# Patient Record
Sex: Female | Born: 1985 | Race: White | Hispanic: No | Marital: Single | Smoking: Current every day smoker
Health system: Southern US, Community
[De-identification: ages and names within clinical notes are randomized; demographics above are authoritative.]

## PROBLEM LIST (undated history)

## (undated) DIAGNOSIS — E119 Type 2 diabetes mellitus without complications: Secondary | ICD-10-CM

## (undated) DIAGNOSIS — F988 Other specified behavioral and emotional disorders with onset usually occurring in childhood and adolescence: Secondary | ICD-10-CM

## (undated) DIAGNOSIS — R87629 Unspecified abnormal cytological findings in specimens from vagina: Secondary | ICD-10-CM

## (undated) HISTORY — PX: LEEP: SHX91

## (undated) HISTORY — DX: Other specified behavioral and emotional disorders with onset usually occurring in childhood and adolescence: F98.8

## (undated) HISTORY — DX: Type 2 diabetes mellitus without complications: E11.9

## (undated) HISTORY — PX: WISDOM TOOTH EXTRACTION: SHX21

## (undated) HISTORY — DX: Unspecified abnormal cytological findings in specimens from vagina: R87.629

---

## 2006-06-07 HISTORY — PX: CHOLECYSTECTOMY: SHX55

## 2010-06-07 NOTE — L&D Delivery Note (Signed)
Delivery Note At 1:26 AM a viable female was delivered via Vaginal, Spontaneous Delivery (Presentation: Left Occiput Anterior).  APGAR: 9, 9; weight 7 lb 0.2 oz (3180 g).   Placenta status: Intact, Spontaneous.  Cord: 3 vessels with the following complications: None.   Anesthesia: 1% lidocaine local infiltrates Episiotomy: None Lacerations: 2nd degree perineal Suture Repair: 2.0 vicryl Est. Blood Loss (mL):  Mom to postpartum.  Baby to nursery-stable.  Mindy Bradford 02/15/2011, 1:43 AM

## 2011-01-07 ENCOUNTER — Ambulatory Visit (INDEPENDENT_AMBULATORY_CARE_PROVIDER_SITE_OTHER): Payer: Self-pay | Admitting: Family Medicine

## 2011-01-07 VITALS — BP 125/78 | Temp 97.7°F | Ht 67.0 in | Wt 239.4 lb

## 2011-01-07 DIAGNOSIS — Z3480 Encounter for supervision of other normal pregnancy, unspecified trimester: Secondary | ICD-10-CM

## 2011-01-07 LAB — POCT URINALYSIS DIP (DEVICE)
Glucose, UA: NEGATIVE mg/dL
Hgb urine dipstick: NEGATIVE
Nitrite: NEGATIVE
Protein, ur: NEGATIVE mg/dL
Urobilinogen, UA: 0.2 mg/dL (ref 0.0–1.0)

## 2011-01-07 NOTE — Progress Notes (Signed)
Edema at feet, pain/pressure- right side/pelvic, no vaginal discharge Waiting on [place]ister from [place].

## 2011-01-07 NOTE — Progress Notes (Signed)
   Subjective:    Mindy Bradford is a G2P1001 [redacted]w[redacted]d being seen today for her first obstetrical visit.  Her obstetrical history is significant for obesity and smoker. Patient does intend to breast feed. Pregnancy history fully reviewed.  Patient reports no complaints.  Filed Vitals:   01/07/11 0936 01/07/11 0938  BP: 125/78   Temp: 97.7 F (36.5 C)   Height:  5\' 7"  (1.702 m)  Weight: 239 lb 6.4 oz (108.591 kg)     HISTORY: OB History    Grav Para Term Preterm Abortions TAB SAB Ect Mult Living   2 1 1       1      # Outc Date GA Lbr Len/2nd Wgt Sex Del Anes PTL Lv   1 TRM 6/06 [redacted]w[redacted]d  6lb4oz(2.835kg) M SVD None  Yes   2 CUR              Past Medical History  Diagnosis Date  . 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 Leep 5 years ago    normal results  . Cholecystectomy 4 years ago   Family History  Problem Relation Age of Onset  . Asthma Mother   . Heart disease    . Cancer    . Diabetes    . Hyperlipidemia    . Hypertension       Exam    Uterine Size: 34 cm                                                                                 Assessment:    Pregnancy: G2P1001 Patient Active Problem List  Diagnoses  . Supervision of other normal pregnancy        Plan:     Initial labs drawn. Prenatal vitamins. Problem list reviewed and updated. Genetic Screening discussed Integrated Screen: requested.  Ultrasound discussed; fetal survey: requested.  Follow up in 2 weeks. 10% of 20 min visit spent on counseling and coordination of care.  Need records from [place].   Mindy Bradford S 01/07/2011

## 2011-01-07 NOTE — Patient Instructions (Signed)
Pregnancy - Third Trimester The third trimester of pregnancy (the last 3 months) is a period of the most rapid growth for you and your baby. The baby approaches a length of 20 inches and a weight of 6 to 10 pounds. The baby is adding on fat and getting ready for life outside your body. While inside, babies have periods of sleeping and waking, suck their thumbs, and hiccups. You can often feel small contractions of the uterus. This is false labor. It is also called Braxton-Hicks contractions. This is like a practice for labor. The usual problems in this stage of pregnancy include more difficulty breathing, swelling of the hands and feet from water retention, and having to urinate more often because of the uterus and baby pressing on your bladder.  PRENATAL EXAMS  Blood work may continue to be done during prenatal exams. These tests are done to check on your health and the probable health of your baby. Blood work is used to follow your blood levels (hemoglobin). Anemia (low hemoglobin) is common during pregnancy. Iron and vitamins are given to help prevent this. You may also continue to be checked for diabetes. Some of the past blood tests may be done again.   The size of the uterus is measured during each visit. This makes sure your baby is growing properly according to your pregnancy dates.   Your blood pressure is checked every prenatal visit. This is to make sure you are not getting toxemia.   Your urine is checked every prenatal visit for infection, diabetes and protein.   Your weight is checked at each visit. This is done to make sure gains are happening at the suggested rate and that you and your baby are growing normally.   Sometimes, an ultrasound is performed to confirm the position and the proper growth and development of the baby. This is a test done that bounces harmless sound waves off the baby so your caregiver can more accurately determine due dates.   Discuss the type of pain  medication and anesthesia you will have during your labor and delivery.   Discuss the possibility and anesthesia if a Cesarean Section might be necessary.   Inform your caregiver if there is any mental or physical violence at home.  Sometimes, a specialized non-stress test, contraction stress test and biophysical profile are done to make sure the baby is not having a problem. Checking the amniotic fluid surrounding the baby is called an amniocentesis. The amniotic fluid is removed by sticking a needle into the belly (abdomen). This is sometimes done near the end of pregnancy if an early delivery is required. In this case, it is done to help make sure the baby's lungs are mature enough for the baby to live outside of the womb. If the lungs are not mature and it is unsafe to deliver the baby, an injection of cortisone medication is given to the mother 1 to 2 days before the delivery. This helps the baby's lungs mature and makes it safer to deliver the baby. CHANGES OCCURING IN THE THIRD TRIMESTER OF PREGNANCY Your body goes through many changes during pregnancy. They vary from person to person. Talk to your caregiver about changes you notice and are concerned about.  During the last trimester, you have probably had an increase in your appetite. It is normal to have cravings for certain foods. This varies from person to person and pregnancy to pregnancy.   You may begin to get stretch marks on your hips,   abdomen, and breasts. These are normal changes in the body during pregnancy. There are no exercises or medications to take which prevent this change.   Constipation may be treated with a stool softener or adding bulk to your diet. Drinking lots of fluids, fiber in vegetables, fruits, and whole grains are helpful.   Exercising is also helpful. If you have been very active up until your pregnancy, most of these activities can be continued during your pregnancy. If you have been less active, it is helpful  to start an exercise program such as walking. Consult your caregiver before starting exercise programs.   Avoid all smoking, alcohol, un-prescribed drugs, herbs and "street drugs" during your pregnancy. These chemicals affect the formation and growth of the baby. Avoid chemicals throughout the pregnancy to ensure the delivery of a healthy infant.   Backache, varicose veins and hemorrhoids may develop or get worse.   You will tire more easily in the third trimester, which is normal.   The baby's movements may be stronger and more often.   You may become short of breath easily.   Your belly button may stick out.   A yellow discharge may leak from your breasts called colostrum.   You may have a bloody mucus discharge. This usually occurs a few days to a week before labor begins.  HOME CARE INSTRUCTIONS  Keep your caregiver's appointments. Follow your caregiver's instructions regarding medication use, exercise, and diet.   During pregnancy, you are providing food for you and your baby. Continue to eat regular, well-balanced meals. Choose foods such as meat, fish, milk and other low fat dairy products, vegetables, fruits, and whole-grain breads and cereals. Your caregiver will tell you of the ideal weight gain.   A physical sexual relationship may be continued throughout pregnancy if there are no other problems such as early (premature) leaking of amniotic fluid from the membranes, vaginal bleeding, or belly (abdominal) pain.   Exercise regularly if there are no restrictions. Check with your caregiver if you are unsure of the safety of your exercises. Greater weight gain will occur in the last 2 trimesters of pregnancy. Exercising helps:   Control your weight.   Get you in shape for labor and delivery.   You lose weight after you deliver.   Rest a lot with legs elevated, or as needed for leg cramps or low back pain.   Wear a good support or jogging bra for breast tenderness during  pregnancy. This may help if worn during sleep. Pads or tissues may be used in the bra if you are leaking colostrum.   Do not use hot tubs, steam rooms, or saunas.   Wear your seat belt when driving. This protects you and your baby if you are in an accident.   Avoid raw meat, cat litter boxes and soil used by cats. These carry germs that can cause birth defects in the baby.   It is easier to loose urine during pregnancy. Tightening up and strengthening the pelvic muscles will help with this problem. You can practice stopping your urination while you are going to the bathroom. These are the same muscles you need to strengthen. It is also the muscles you would use if you were trying to stop from passing gas. You can practice tightening these muscles up 10 times a set and repeating this about 3 times per day. Once you know what muscles to tighten up, do not perform these exercises during urination. It is more likely to  cause an infection by backing up the urine.   Ask for help if you have financial, counseling or nutritional needs during pregnancy. Your caregiver will be able to offer counseling for these needs as well as refer you for other special needs.   Make a list of emergency phone numbers and have them available.   Plan on getting help from family or friends when you go home from the hospital.   Make a trial run to the hospital.   Take prenatal classes with the father to understand, practice and ask questions about the labor and delivery.   Prepare the baby's room/nursery.   Do not travel out of the city unless it is absolutely necessary and with the advice of your caregiver.   Wear only low or no heal shoes to have better balance and prevent falling.  MEDICATIONS AND DRUG USE IN PREGNANCY  Take prenatal vitamins as directed. The vitamin should contain 1 milligram of folic acid. Keep all vitamins out of reach of children. Only a couple vitamins or tablets containing iron may be fatal  to a baby or young child when ingested.   Avoid use of all medications, including herbs, over-the-counter medications, not prescribed or suggested by your caregiver. Only take over-the-counter or prescription medicines for pain, discomfort, or fever as directed by your caregiver. Do not use aspirin, ibuprofen (Motrin, Advil, Nuprin) or naproxen (Aleve) unless OK'd by your caregiver.   Let your caregiver also know about herbs you may be using.   Alcohol is related to a number of birth defects. This includes fetal alcohol syndrome. All alcohol, in any form, should be avoided completely. Smoking will cause low birth rate and premature babies.   Street/illegal drugs are very harmful to the baby. They are absolutely forbidden. A baby born to an addicted mother will be addicted at birth. The baby will go through the same withdrawal an adult does.  SEEK MEDICAL CARE IF  You have any concerns or worries during your pregnancy. It is better to call with your questions if you feel they cannot wait, rather than worry about them.  DECISIONS ABOUT CIRCUMCISION You may or may not know the sex of your baby. If you know your baby is a boy, it may be time to think about circumcision. Circumcision is the removal of the foreskin of the penis. This is the skin that covers the sensitive end of the penis. There is no proven medical need for this. Often this decision is made on what is popular at the time or based upon religious beliefs and social issues. You can discuss these issues with your caregiver or pediatrician. SEEK IMMEDIATE MEDICAL CARE IF:  An unexplained oral temperature above 101 develops, or as your caregiver suggests.   You have leaking of fluid from the vagina (birth canal). If leaking membranes are suspected, take your temperature and tell your caregiver of this when you call.   There is vaginal spotting, bleeding or passing clots. Tell your caregiver of the amount and how many pads are used.    You develop a bad smelling vaginal discharge with a change in the color from clear to white.   You develop vomiting that lasts more than 24 hours.   You develop chills or fever.   You develop shortness of breath.   You develop burning on urination.   You loose more than 2 pounds of weight or gain more than 2 pounds of weight or as suggested by your caregiver.  You notice sudden swelling of your face, hands, and feet or legs.   You develop belly (abdominal) pain. Round ligament discomfort is a common non-cancerous (benign) cause of abdominal pain in pregnancy. Your caregiver still must evaluate you.   You develop a severe headache that does not go away.   You develop visual problems, blurred or double vision.   If you have not felt your baby move for more than 1 hour. If you think the baby is not moving as much as usual, eat something with sugar in it and lie down on your left side for an hour. The baby should move at least 4 to 5 times per hour. Call right away if your baby moves less than that.   You fall, are in a car accident or any kind of trauma.   There is mental or physical violence at home.  Document Released: 05/18/2001 Document Re-Released: 03/21/2009 [facility] Patient Information 2011 [place], [place]. Smoking During Pregnancy Smoking during pregnancy is very unhealthy for the mother and the developing fetus. The addictive drug in cigarettes (nicotine), carbon monoxide, and many other poisons are inhaled from a cigarette and are carried through your bloodstream to your fetus. Cigarette smoke contains more than 2,500 chemicals. It is not known which of these chemicals are harmful to the developing fetus. However, both nicotine and carbon monoxide play a role in causing health problems in pregnancy. EFFECTS ON THE FETUS WHEN SMOKING DURING PREGNANCY  Decrease in blood flow and oxygen to the uterus, placenta, and your fetus.   Increased heart rate of the fetus.   Slowing  of your fetus's growth in the uterus (intrauterine growth retardation).   Placental problems. Placenta may partially cover or completely cover the opening to the cervix (placenta previa), or the placenta may partially or completely separate from the uterus (placental abruption).   Increase risk of pregnancy outside of the uterus (tubal pregnancy).   Premature rupture membranes, causing the sac that holds the fetus to break too early, resulting in premature birth and increased health risks to the newborn.   Increased risk of birth defects, including heart defects.   Increased risk of miscarriage.  NEWBORNS BORN TO WOMEN WHO SMOKE DURING PREGNANCY:  Are more likely to be born too early (prematurely).   Are more likely to be at a low birth weight.   Are at risk for serious health problems, chronic or lifelong disabilities (cerebral palsy, mental retardation, learning problems), and possibly even death   Are at risk of Sudden Infant Death Syndrome (SIDS).   Have higher rates of miscarriage and stillbirth.   Have more lung and breathing (respiratory) problems.  LONG-TERM EFFECTS ON A CHILD'S BEHAVIOR Some of the following trends are seen with children of smoking mothers:  Increased risk for drug abuse, behavior, and conduct disorders.   Increased risk for smoking in adolescent girls.   Increased risk for negative behavior in 2-year-olds.   Increase risk for asthma, colic, and childhood obesity, which can lead to diabetes.   Increased risk for finger and toe disorders.  RESOURCES TO STOP SMOKING DURING PREGNANCY  Counseling.  Psychological treatment.   Acupuncture.   Family intervention.   Hypnosis.   Medicines that are safe to take during pregnancy. Nicotine supplements have not been studied enough. They should only be considered when all other methods fail.  Telephone QUIT lines.   SMOKING AND BREASTFEEDING  Nicotine gets passed to the infant through her breastmilk.  This can cause nausea, colic, cramping,  and diarrhea in the infant.   Smoking may reduce milk supply and interfere with the let-down response.   Even formula-fed infants of mothers who smoke have nicotine and cotinine (nicotine by-product) in their urine.  OTHER RESOURCES TO HELP STOP SMOKING  American Cancer Society: www.cancer.org   American Heart Association: www.americanheart.org   National Cancer Institute: www.cancer.gov   Smoke Free Families: www.smokefreefamilies.[address] [place] Line): [phone] START  Document Released: 10/05/2004 Document Re-Released: 11/11/2009 [facility] Patient Information 2011 [place], [place]. Breastfeeding Your Baby BENEFITS OF BREASTFEEDING For the baby  The first milk (colostrum) helps the baby's digestive system function better.   There are antibodies from the mother in the milk that help the baby fight off infections.   The baby has a lower incidence of asthma, allergies and SIDS (sudden infant death syndrome).   The nutrients in breast milk are better than formulas for the baby and helps the baby's brain grow better.   Babies who breastfeed have less gas, colic and constipation.  For the mother  Breastfeeding helps develop a very special bond between mother and baby.   It is more convenient, always available at the correct temperature and cheaper than formula feeding.   It burns calories in the mother and helps with losing weight that was gained during pregnancy.   It makes the uterus contract back down to normal size faster and slows bleeding following delivery.   Breastfeeding mothers have a lower risk of developing breast cancer.  NURSE FREQUENTLY  A healthy, full-term baby may breastfeed as often as every hour or space his feedings to every three hours.   How often to nurse will vary from baby to baby. Watch your baby for signs of hunger, not the clock.   Nurse as often as the baby requests, or when you feel the need to  reduce the fullness of your breasts.   Awaken the baby if it has been 3-4 hours since the last feeding.   Frequent feeding will help the mother make more milk and will prevent problems like sore nipples and engorgement of the breasts.  BABY'S POSITION AT THE BREAST  Whether lying down or sitting, be sure that the baby's tummy is facing your tummy.   Support the breast with four fingers underneath the breast and the thumb above. Make sure your fingers are well away from the nipple and baby's mouth.   Stroke the baby's lips and cheek closest to the breast gently with your finger or nipple.   When the baby's mouth is open wide enough, place all of your nipple and as much of the dark area around the nipple as possible into your baby's mouth.   Pull the baby in close so the tip of the nose and the baby's cheeks touch the breast during the feeding.  FEEDINGS  The length of each feeding varies from baby to baby and from feeding to feeding.   The baby must suck about two to three minutes for your milk to get to the him. This is called a "let down." For this reason, allow the baby to feed on each breast as long as he wants. He will end the feeding when he has received the right balance of nutrients.   To break the suction, put your finger into the corner of the baby's mouth and slide it between his gums before removing your breast from his mouth. This will help prevent sore nipples.  REDUCING BREAST ENGORGEMENT  In the first  week after your baby is born, you may experience signs of breast engorgement. When breasts are engorged, they feel heavy, warm, full and may be tender to the touch. You can reduce engorgement if you:   Nurse frequently, every 2-3 hours. Mothers who breastfeed early and often have fewer problems with engorgement.   Place light ice packs (bags of frozen corn or peas work well) on your breasts between feedings. This reduces swelling. Wrap the ice packs in a lightweight towel to  protect your skin.   Apply moist hot packs to your breast for 5-10 minutes before each feeding. This increases circulation and helps the milk flow.   Gently massage your breast before and during the feeding.   Make sure that the baby empties at least one breast at every feeding before switching sides.   Use a breast pump to empty the breasts if your baby is sleepy or not nursing well. You may also want to pump if you are returning to work or or you feel you are getting engorged.   Avoid bottle feeds, pacifiers or supplemental feedings of water or juice in place of breastfeeding.   Be sure the baby is latched on and positioned properly while breastfeeding.   Prevent fatigue, stress and anemia.   Wear a supportive bra, avoiding underwire styles.   Eat a balanced diet with enough fluids.  If you follow these suggestions, your engorgement should improve in 24-48 hours. If you are still experiencing difficulty, call your lactation consultant or caregiver. IS MY BABY GETTING ENOUGH MILK? Sometimes, mothers worry about whether their babies are getting enough milk. You can be assured that your baby is getting enough milk if:  The baby is actively sucking and you hear swallowing.   The baby nurses at least 8-12 times in a 24 hour time period. Nurse him until he unlatches himself or falls asleep at the first breast (at least 10-20 minutes), then offer the second side.   The baby is wetting 5-6 disposable diapers (6-8 cloth diapers) in a 24 hour period by 45-98 days of age.   The baby is having at least 2-3 stools every 24 hours for the first few months. Breast milk is all the food your baby needs. It is not necessary for your baby to have water or formula. In fact, to help your breasts make more milk, it is best not to give your baby supplemental feedings during the early weeks.   The stool should be soft and yellow.   The baby should gain 4-6 ounces per week after he is 89 days old.  TAKE CARE  OF YOURSELF Take care of your breasts by:  Bathing or showering daily.   Avoiding the use of soaps on your nipples.   Start feedings on your left breast at one feeding and on your right breast at the next feeding.   You will notice an increase in your milk supply 2-5 days after delivery. You may feel some discomfort from engorgement, which makes your breasts very firm and often tender. Engorgement "peaks" out within 24 to 48 hours. In the meantime, apply warm moist towels to your breasts for 5-10 minutes before feeding. Gentle massage and expression of some milk before feeding will soften your breasts, making it easier for your baby to latch on. Wear a well fitting nursing bra and air dry your nipples for 10-15 minutes after each feeding.   Only use cotton bra pads.   Only use pure lanolin on your  nipples after nursing. You do not need to wash it off before nursing.  Take care of yourself by:   Eating well-balanced meals and nutritious snacks.   Drinking milk, fruit juice and water to satisfy your thirst (about 8 glasses/day).   Getting plenty of rest.   Increase calcium in your diet (1200mg /day).   Avoid foods that you notice affect the baby in a bad way.  SEEK MEDICAL CARE IF:  You have any questions or difficulty with breastfeeding.   You need help.   You have a hard, red, sore area on your breast, accompanied by a fever of 100.5 F (38.1 C) or more.   Your baby is too sleepy to eat well or is having trouble sleeping.   Your baby is wetting less than 6 diapers per day, by 66 days of age.   Your baby's skin or white part of his/her eyes is more yellow than it was in the hospital.   You feel depressed.  Document Released: 05/24/2005 Document Re-Released: 03/20/2009 [facility] Patient Information 2011 [place], [place].

## 2011-01-21 ENCOUNTER — Other Ambulatory Visit: Payer: Self-pay | Admitting: Obstetrics & Gynecology

## 2011-01-21 ENCOUNTER — Ambulatory Visit (INDEPENDENT_AMBULATORY_CARE_PROVIDER_SITE_OTHER): Payer: Self-pay | Admitting: Obstetrics & Gynecology

## 2011-01-21 VITALS — BP 121/77 | Temp 97.0°F | Wt 238.5 lb

## 2011-01-21 DIAGNOSIS — Z3480 Encounter for supervision of other normal pregnancy, unspecified trimester: Secondary | ICD-10-CM

## 2011-01-21 DIAGNOSIS — O99330 Smoking (tobacco) complicating pregnancy, unspecified trimester: Secondary | ICD-10-CM

## 2011-01-21 NOTE — Progress Notes (Signed)
Snormalubjective:    Mindy Bradford is a 25 y.o. G2P1001 [redacted]w[redacted]d being seen today for her obstetrical visit.  Patient reports headache. Fetal movement: normal.  Objective:    BP 121/77  Temp 97 F (36.1 C)  Wt 238 lb 8 oz (108.183 kg)  Physical Exam  Exam  FHT:  138 BPM  Uterine Size: 36 cm  Presentation: unsure     Assessment:    Pregnancy:  G2P1001    Plan:    Patient Active Problem List  Diagnoses  . Supervision of other normal pregnancy    nutritionist Follow up in 1 Week.

## 2011-01-21 NOTE — Progress Notes (Signed)
Pain-lower back,no vaginal discharge, pulse-85

## 2011-01-21 NOTE — Progress Notes (Signed)
Nutrition Note: Referred for Nutrition assessment, Dx obesity, smoker; has WIC. Weight gain of 13.5# plots WNL for weeks gestation. Intake- adequate with a wide variety of foods, no reported food allergies. Follow-up if referred. Cy Blamer, RD

## 2011-01-28 ENCOUNTER — Ambulatory Visit (INDEPENDENT_AMBULATORY_CARE_PROVIDER_SITE_OTHER): Payer: Self-pay | Admitting: Obstetrics & Gynecology

## 2011-01-28 VITALS — BP 126/75 | Temp 97.2°F | Wt 238.7 lb

## 2011-01-28 DIAGNOSIS — O99330 Smoking (tobacco) complicating pregnancy, unspecified trimester: Secondary | ICD-10-CM

## 2011-01-28 DIAGNOSIS — Z3480 Encounter for supervision of other normal pregnancy, unspecified trimester: Secondary | ICD-10-CM

## 2011-01-28 LAB — POCT URINALYSIS DIP (DEVICE)
Bilirubin Urine: NEGATIVE
Hgb urine dipstick: NEGATIVE
Leukocytes, UA: NEGATIVE
Nitrite: NEGATIVE
Urobilinogen, UA: 0.2 mg/dL (ref 0.0–1.0)
pH: 7 (ref 5.0–8.0)

## 2011-01-28 NOTE — Progress Notes (Signed)
Pulse 89.  Pain- shooting pain pelvic.  No vaginal discharge.  No contractions. GC,CT, GBS done, cx 40%/1-2/ceph/-2. PTL precautions

## 2011-01-30 LAB — STREP B DNA PROBE: GBSP: NEGATIVE

## 2011-02-04 ENCOUNTER — Telehealth: Payer: Self-pay | Admitting: *Deleted

## 2011-02-04 ENCOUNTER — Ambulatory Visit (INDEPENDENT_AMBULATORY_CARE_PROVIDER_SITE_OTHER): Payer: Self-pay | Admitting: Family Medicine

## 2011-02-04 DIAGNOSIS — O99330 Smoking (tobacco) complicating pregnancy, unspecified trimester: Secondary | ICD-10-CM

## 2011-02-04 DIAGNOSIS — O3660X0 Maternal care for excessive fetal growth, unspecified trimester, not applicable or unspecified: Secondary | ICD-10-CM

## 2011-02-04 DIAGNOSIS — IMO0002 Reserved for concepts with insufficient information to code with codable children: Secondary | ICD-10-CM

## 2011-02-04 DIAGNOSIS — Z3480 Encounter for supervision of other normal pregnancy, unspecified trimester: Secondary | ICD-10-CM

## 2011-02-04 LAB — POCT URINALYSIS DIP (DEVICE)
Hgb urine dipstick: NEGATIVE
Ketones, ur: NEGATIVE mg/dL
Protein, ur: NEGATIVE mg/dL
Specific Gravity, Urine: 1.02 (ref 1.005–1.030)
Urobilinogen, UA: 0.2 mg/dL (ref 0.0–1.0)
pH: 7.5 (ref 5.0–8.0)

## 2011-02-04 NOTE — Progress Notes (Signed)
Needs u/s growth  Subjective:    Mindy Bradford is a 25 y.o. G2P1001 [redacted]w[redacted]d being seen today for her obstetrical visit.  Patient reports no complaints. Fetal movement: normal.  Objective:    BP 123/72  Temp 97.4 F (36.3 C)  Wt 242 lb 8 oz (109.997 kg)  Physical Exam  Exam  FHT:  125 BPM  Uterine Size: 41 cm  Presentation: cephalic     Assessment:    Pregnancy:  G2P1001    Plan:    Patient Active Problem List  Diagnoses  . Supervision of other normal pregnancy  . Tobacco smoking complicating pregnancy    u/s growth S>D Follow up in 1 Week.

## 2011-02-04 NOTE — Patient Instructions (Signed)
Pregnancy - Third Trimester The third trimester of pregnancy (the last 3 months) is a period of the most rapid growth for you and your baby. The baby approaches a length of 20 inches and a weight of 6 to 10 pounds. The baby is adding on fat and getting ready for life outside your body. While inside, babies have periods of sleeping and waking, suck their thumbs, and hiccups. You can often feel small contractions of the uterus. This is false labor. It is also called Braxton-Hicks contractions. This is like a practice for labor. The usual problems in this stage of pregnancy include more difficulty breathing, swelling of the hands and feet from water retention, and having to urinate more often because of the uterus and baby pressing on your bladder.  PRENATAL EXAMS  Blood work may continue to be done during prenatal exams. These tests are done to check on your health and the probable health of your baby. Blood work is used to follow your blood levels (hemoglobin). Anemia (low hemoglobin) is common during pregnancy. Iron and vitamins are given to help prevent this. You may also continue to be checked for diabetes. Some of the past blood tests may be done again.   The size of the uterus is measured during each visit. This makes sure your baby is growing properly according to your pregnancy dates.   Your blood pressure is checked every prenatal visit. This is to make sure you are not getting toxemia.   Your urine is checked every prenatal visit for infection, diabetes and protein.   Your weight is checked at each visit. This is done to make sure gains are happening at the suggested rate and that you and your baby are growing normally.   Sometimes, an ultrasound is performed to confirm the position and the proper growth and development of the baby. This is a test done that bounces harmless sound waves off the baby so your caregiver can more accurately determine due dates.   Discuss the type of pain  medication and anesthesia you will have during your labor and delivery.   Discuss the possibility and anesthesia if a Cesarean Section might be necessary.   Inform your caregiver if there is any mental or physical violence at home.  Sometimes, a specialized non-stress test, contraction stress test and biophysical profile are done to make sure the baby is not having a problem. Checking the amniotic fluid surrounding the baby is called an amniocentesis. The amniotic fluid is removed by sticking a needle into the belly (abdomen). This is sometimes done near the end of pregnancy if an early delivery is required. In this case, it is done to help make sure the baby's lungs are mature enough for the baby to live outside of the womb. If the lungs are not mature and it is unsafe to deliver the baby, an injection of cortisone medication is given to the mother 1 to 2 days before the delivery. This helps the baby's lungs mature and makes it safer to deliver the baby. CHANGES OCCURING IN THE THIRD TRIMESTER OF PREGNANCY Your body goes through many changes during pregnancy. They vary from person to person. Talk to your caregiver about changes you notice and are concerned about.  During the last trimester, you have probably had an increase in your appetite. It is normal to have cravings for certain foods. This varies from person to person and pregnancy to pregnancy.   You may begin to get stretch marks on your hips,   abdomen, and breasts. These are normal changes in the body during pregnancy. There are no exercises or medications to take which prevent this change.   Constipation may be treated with a stool softener or adding bulk to your diet. Drinking lots of fluids, fiber in vegetables, fruits, and whole grains are helpful.   Exercising is also helpful. If you have been very active up until your pregnancy, most of these activities can be continued during your pregnancy. If you have been less active, it is helpful  to start an exercise program such as walking. Consult your caregiver before starting exercise programs.   Avoid all smoking, alcohol, un-prescribed drugs, herbs and "street drugs" during your pregnancy. These chemicals affect the formation and growth of the baby. Avoid chemicals throughout the pregnancy to ensure the delivery of a healthy infant.   Backache, varicose veins and hemorrhoids may develop or get worse.   You will tire more easily in the third trimester, which is normal.   The baby's movements may be stronger and more often.   You may become short of breath easily.   Your belly button may stick out.   A yellow discharge may leak from your breasts called colostrum.   You may have a bloody mucus discharge. This usually occurs a few days to a week before labor begins.  HOME CARE INSTRUCTIONS  Keep your caregiver's appointments. Follow your caregiver's instructions regarding medication use, exercise, and diet.   During pregnancy, you are providing food for you and your baby. Continue to eat regular, well-balanced meals. Choose foods such as meat, fish, milk and other low fat dairy products, vegetables, fruits, and whole-grain breads and cereals. Your caregiver will tell you of the ideal weight gain.   A physical sexual relationship may be continued throughout pregnancy if there are no other problems such as early (premature) leaking of amniotic fluid from the membranes, vaginal bleeding, or belly (abdominal) pain.   Exercise regularly if there are no restrictions. Check with your caregiver if you are unsure of the safety of your exercises. Greater weight gain will occur in the last 2 trimesters of pregnancy. Exercising helps:   Control your weight.   Get you in shape for labor and delivery.   You lose weight after you deliver.   Rest a lot with legs elevated, or as needed for leg cramps or low back pain.   Wear a good support or jogging bra for breast tenderness during  pregnancy. This may help if worn during sleep. Pads or tissues may be used in the bra if you are leaking colostrum.   Do not use hot tubs, steam rooms, or saunas.   Wear your seat belt when driving. This protects you and your baby if you are in an accident.   Avoid raw meat, cat litter boxes and soil used by cats. These carry germs that can cause birth defects in the baby.   It is easier to loose urine during pregnancy. Tightening up and strengthening the pelvic muscles will help with this problem. You can practice stopping your urination while you are going to the bathroom. These are the same muscles you need to strengthen. It is also the muscles you would use if you were trying to stop from passing gas. You can practice tightening these muscles up 10 times a set and repeating this about 3 times per day. Once you know what muscles to tighten up, do not perform these exercises during urination. It is more likely to  cause an infection by backing up the urine.   Ask for help if you have financial, counseling or nutritional needs during pregnancy. Your caregiver will be able to offer counseling for these needs as well as refer you for other special needs.   Make a list of emergency phone numbers and have them available.   Plan on getting help from family or friends when you go home from the hospital.   Make a trial run to the hospital.   Take prenatal classes with the father to understand, practice and ask questions about the labor and delivery.   Prepare the baby's room/nursery.   Do not travel out of the city unless it is absolutely necessary and with the advice of your caregiver.   Wear only low or no heal shoes to have better balance and prevent falling.  MEDICATIONS AND DRUG USE IN PREGNANCY  Take prenatal vitamins as directed. The vitamin should contain 1 milligram of folic acid. Keep all vitamins out of reach of children. Only a couple vitamins or tablets containing iron may be fatal  to a baby or young child when ingested.   Avoid use of all medications, including herbs, over-the-counter medications, not prescribed or suggested by your caregiver. Only take over-the-counter or prescription medicines for pain, discomfort, or fever as directed by your caregiver. Do not use aspirin, ibuprofen (Motrin, Advil, Nuprin) or naproxen (Aleve) unless OK'd by your caregiver.   Let your caregiver also know about herbs you may be using.   Alcohol is related to a number of birth defects. This includes fetal alcohol syndrome. All alcohol, in any form, should be avoided completely. Smoking will cause low birth rate and premature babies.   Street/illegal drugs are very harmful to the baby. They are absolutely forbidden. A baby born to an addicted mother will be addicted at birth. The baby will go through the same withdrawal an adult does.  SEEK MEDICAL CARE IF  You have any concerns or worries during your pregnancy. It is better to call with your questions if you feel they cannot wait, rather than worry about them.  DECISIONS ABOUT CIRCUMCISION You may or may not know the sex of your baby. If you know your baby is a boy, it may be time to think about circumcision. Circumcision is the removal of the foreskin of the penis. This is the skin that covers the sensitive end of the penis. There is no proven medical need for this. Often this decision is made on what is popular at the time or based upon religious beliefs and social issues. You can discuss these issues with your caregiver or pediatrician. SEEK IMMEDIATE MEDICAL CARE IF:  An unexplained oral temperature above 101 develops, or as your caregiver suggests.   You have leaking of fluid from the vagina (birth canal). If leaking membranes are suspected, take your temperature and tell your caregiver of this when you call.   There is vaginal spotting, bleeding or passing clots. Tell your caregiver of the amount and how many pads are used.    You develop a bad smelling vaginal discharge with a change in the color from clear to white.   You develop vomiting that lasts more than 24 hours.   You develop chills or fever.   You develop shortness of breath.   You develop burning on urination.   You loose more than 2 pounds of weight or gain more than 2 pounds of weight or as suggested by your caregiver.  You notice sudden swelling of your face, hands, and feet or legs.   You develop belly (abdominal) pain. Round ligament discomfort is a common non-cancerous (benign) cause of abdominal pain in pregnancy. Your caregiver still must evaluate you.   You develop a severe headache that does not go away.   You develop visual problems, blurred or double vision.   If you have not felt your baby move for more than 1 hour. If you think the baby is not moving as much as usual, eat something with sugar in it and lie down on your left side for an hour. The baby should move at least 4 to 5 times per hour. Call right away if your baby moves less than that.   You fall, are in a car accident or any kind of trauma.   There is mental or physical violence at home.  Document Released: 05/18/2001 Document Re-Released: 03/21/2009 [facility] Patient Information 2011 [place], [place]. Smoking During Pregnancy Smoking during pregnancy is very unhealthy for the mother and the developing fetus. The addictive drug in cigarettes (nicotine), carbon monoxide, and many other poisons are inhaled from a cigarette and are carried through your bloodstream to your fetus. Cigarette smoke contains more than 2,500 chemicals. It is not known which of these chemicals are harmful to the developing fetus. However, both nicotine and carbon monoxide play a role in causing health problems in pregnancy. EFFECTS ON THE FETUS WHEN SMOKING DURING PREGNANCY  Decrease in blood flow and oxygen to the uterus, placenta, and your fetus.   Increased heart rate of the fetus.   Slowing  of your fetus's growth in the uterus (intrauterine growth retardation).   Placental problems. Placenta may partially cover or completely cover the opening to the cervix (placenta previa), or the placenta may partially or completely separate from the uterus (placental abruption).   Increase risk of pregnancy outside of the uterus (tubal pregnancy).   Premature rupture membranes, causing the sac that holds the fetus to break too early, resulting in premature birth and increased health risks to the newborn.   Increased risk of birth defects, including heart defects.   Increased risk of miscarriage.  NEWBORNS BORN TO WOMEN WHO SMOKE DURING PREGNANCY:  Are more likely to be born too early (prematurely).   Are more likely to be at a low birth weight.   Are at risk for serious health problems, chronic or lifelong disabilities (cerebral palsy, mental retardation, learning problems), and possibly even death   Are at risk of Sudden Infant Death Syndrome (SIDS).   Have higher rates of miscarriage and stillbirth.   Have more lung and breathing (respiratory) problems.  LONG-TERM EFFECTS ON A CHILD'S BEHAVIOR Some of the following trends are seen with children of smoking mothers:  Increased risk for drug abuse, behavior, and conduct disorders.   Increased risk for smoking in adolescent girls.   Increased risk for negative behavior in 2-year-olds.   Increase risk for asthma, colic, and childhood obesity, which can lead to diabetes.   Increased risk for finger and toe disorders.  RESOURCES TO STOP SMOKING DURING PREGNANCY  Counseling.  Psychological treatment.   Acupuncture.   Family intervention.   Hypnosis.   Medicines that are safe to take during pregnancy. Nicotine supplements have not been studied enough. They should only be considered when all other methods fail.  Telephone QUIT lines.   SMOKING AND BREASTFEEDING  Nicotine gets passed to the infant through her breastmilk.  This can cause nausea, colic, cramping,  and diarrhea in the infant.   Smoking may reduce milk supply and interfere with the let-down response.   Even formula-fed infants of mothers who smoke have nicotine and cotinine (nicotine by-product) in their urine.  OTHER RESOURCES TO HELP STOP SMOKING  American Cancer Society: www.cancer.org   American Heart Association: www.americanheart.org   National Cancer Institute: www.cancer.gov   Smoke Free Families: www.smokefreefamilies.[address] Annex Line): 423 062 6414 START  Document Released: 10/05/2004 Document Re-Released: 11/11/2009 [facility] Patient Information 2011 [place], [place]. Amamantar al beb (Breastfeeding Your Baby) LOS BENEFICIOS DE AMAMANTAR Para el beb  La primera leche (calostro ) ayuda al mejor funcionamiento del sistema digestivo del beb.   La leche tiene anticuerpos que provienen de la madre y que ayudan a prevenir las infecciones en el beb.   Hay una menor incidencia de asma, enfermedades alrgicas y SMSI (sndrome de muerte sbita nfantil).   Los nutrientes que contiene la [place] materna son mejores que las frmulas para el bibern y favorecen el desarrollo cerebral.   Los bebs amamantados sufren menos gases, clicos y constipacin.  Para la mam  La lactancia materna favorece el desarrollo de un vnculo muy especial entre la madre y el beb.   Es ms conveniente, siempre disponible a la Optician, dispensing y ms econmica que la [employer]   Consume caloras en la madre y la ayuda a perder el peso ganado durante el [place].   Favorece la contraccin del tero a su tamao normal, de manera ms rpida y [employer] las hemorragias luego del [place].   Las [employer] que amamantan tienen menor riesgo de Geophysical data processor de mama.  AMAMNTELO CON FRECUENCIA  Un beb sano, nacido a trmino, puede amamantarse con tanta frecuencia como cada hora, o espaciar las comidas cada tres horas.   Esta frecuencia  variar de un beb a otro. Observe al beb cuando manifieste signos de hambre, antes que regirse por el reloj.   Amamntelo tan seguido como el beb lo solicite, o cuando usted sienta la necesidad de Paramedic sus [place].   Despierte al beb si han pasado 3  4 horas desde la ltima comida.   El amamantamiento frecuente la ayudar a producir ms [place] y a Education officer, community de Engineer, mining en los pezones e hinchazn de las [place].  LA POSICIN DEL BEB PARA AMAMANTARLO  Ya sea que se encuentre acostada o sentada, asegrese que el abdomen del beb enfrente el suyo.   Sostenga la mama con el pulgar por arriba y el resto de los dedos por debajo. Asegrese que sus dedos se encuentren lejos del pezn y de la boca del beb.   Toque suavemente los labios del beb y la mejilla ms cercana a la mama con el dedo o el pezn.   Cuando la boca del beb se abra lo suficiente, introduzca el pezn y la zona oscura que lo rodea tanto como le sea posible dentro de la boca.   Coloque a beb cerca suyo de modo que su nariz y mejillas toquen las mamas al [employer].  LAS COMIDAS  La duracin de cada comida vara de un beb a otro y de [place] comida a [place].   El beb debe succionar alrededor [employer] o tres minutos para que le llegue [place]. Esto se denomina "bajada". Por este motivo, permita que el nio se alimente en cada mama todo lo que desee. Terminar de mamar cuando haya recibido la cantidad [place] de nutrientes.   Para detener la succin coloque su dedo en  la comisura de la boca del nio y Midwife entre sus encas antes de quitarle la mama de la boca. Esto la ayudar a English as a second language teacher.  REDUCIR LA CONGESTIN DE LAS MAMAS  Durante la primera semana despus del parto, usted puede experimentar [employer]. Cuando las mamas estn congestionadas, se sienten calientes, llenas y molestas al tacto. Puede reducir la congestin si:   Lo amamanta frecuentemente, cada 2-3 horas. Las mams que [employer]  pronto y con frecuencia tienen menos problemas de [place].   Coloque bolsas fras livianas (las bolsas de maz o guisantes congelados son tiles) entre cada [place]. Esto ayuda a Building services engineer. Envuelva las bolsas de hielo en una toalla liviana para proteger su piel.   Aplique compresas hmedas calientes [employer] durante 5 a 10 minutos antes de amamantar al [employer]. Esto aumenta la circulacin y [place] a que la [place].   Masajee suavemente la mama antes y Psychologist, sport and exercise.   Asegrese que el nio vaca al menos una mama antes de cambiar de lado.   Use un sacaleche para vaciar la mama si el beb se duerme o no se alimenta bien. Tambin podr [employer] con esta bomba si tiene que volver al trabajo o siente que las mamas estn congestionadas.   Evite los biberones, chupetes o complementar la alimentacin con agua o jugos en lugar de la [place].   Verifique que el beb se encuentra en la posicin correcta mientras lo alimenta.   Evite el cansancio, el estrs y la anemia   Use un soutien que sostenga bien sus mamas y evite los que tienen aro.   Consuma una dieta balanceada y beba lquidos en cantidad.  Si sigue estas indicaciones, la congestin debe mejorar en 24 a 48 horas. Si an tiene dificultades, consulte a Barista. TENDR SUFICIENTE LECHE MI BEB? Algunas veces las madres se preocupan acerca de si sus bebs tendrn la leche suficiente. Puede asegurarse que el beb tiene la leche suficiente si:  El beb succiona y escucha que traga activamente.   El nio se alimenta al menos 8 a 12 veces en 24 horas. Alimntelo hasta que se desprenda por sus propios medios o se quede dormido en la primera mama (al menos durante 10 a 20 minutos), luego ofrzcale el otro lado.   El beb moja 5 a 6 paales descartables (6 a 8 paales de tela) en 24 horas cuando tiene 5  6 das de vida.   Tiene al menos 2-3 deposiciones todos los [employer] primeros  meses. La leche materna es todo el alimento que el beb necesita. No es necesario que el nio ingiera agua o preparados de bibern. De hecho, para ayudar a que sus mamas produzcan ms [place], lo mejor es no darle al beb suplementos durante las primeras semanas.   La materia fecal debe ser blanda y [place].   El beb debe aumentar 4 a 6 libras (120 gr. a 170 gr. por semana.  CUDESE Cuide sus mamas del siguiente modo:  Bese o dchese diariamente.   No lave sus pezones con jabn.   Comience a amamantar del lado izquierdo en una comida y del lado derecho en la siguiente.   Notar que [employer] suministro de [place] a los 2 a 5 [address]  [address] despus del [place]. Puede sentir algunas molestias por la congestin, lo que hace que sus mamas estn duras y sensibles. La congestin disminuye en 24 a  48 horas. Mientras tanto, aplique toallas hmedas calientes durante 5 a 10 minutos antes de amamantar. Un masaje suave y la extraccin de un poco de leche antes de Museum/gallery exhibitions officer ablandarn las mamas y har ms fcil que el beb se agarre. Use un buen sostn y seque al aire los pezones durante 10 a 15 minutos luego de cada alimentacin.   Solo utilice apsitos de algodn.   Utilice lanolina [employer] pezones luego de [place]. No necesita lavarlos luego de alimentar al [employer].  Cudese del siguiente modo:   Consuma alimentos bien balanceados y refrigerios nutritivos.   Dixie Dials, jugos de fruta y agua para Warehouse manager sed (alrededor de 8 vasos por Futures trader).   Descanse lo suficiente.   Aumente la ingesta de calcio en la dieta (1200mg /da).   Evite los alimentos que usted nota que puedan afectar al beb.  SOLICITE ATENCIN MDICA SI:  Tiene preguntas que formular o dificultades con la alimentacin a pecho.   Necesita ayuda.   Observa una zona dura, roja y que le duele en la zona de la mama, y se acompaa de fiebre de 100.5 F (38.1 C) o ms.   El beb est muy somnoliento como para alimentarse bien o  tiene problemas para dormir.   El beb moja menos de 6 paales por da, a partir de los [address] de [place].   La piel del beb o la parte blanca de sus ojos est ms amarilla de lo que estaba en el hospital.   Se siente deprimida.  Document Released: 05/24/2005 Document Re-Released: 08/18/2009 [facility] Patient Information 2011 [place], [place].

## 2011-02-04 NOTE — Progress Notes (Signed)
Some pelvic pain, no contractions. Awaiting pt records, has had all labs and gtt done at previous md office.

## 2011-02-09 ENCOUNTER — Ambulatory Visit (HOSPITAL_COMMUNITY)
Admission: RE | Admit: 2011-02-09 | Discharge: 2011-02-09 | Disposition: A | Discharge status: Home / Self Care | Payer: Self-pay | Source: Ambulatory Visit | Attending: Family Medicine | Admitting: Family Medicine

## 2011-02-09 DIAGNOSIS — Z3689 Encounter for other specified antenatal screening: Secondary | ICD-10-CM | POA: Insufficient documentation

## 2011-02-09 DIAGNOSIS — O3660X0 Maternal care for excessive fetal growth, unspecified trimester, not applicable or unspecified: Secondary | ICD-10-CM | POA: Insufficient documentation

## 2011-02-11 ENCOUNTER — Ambulatory Visit: Payer: Self-pay | Admitting: Family Medicine

## 2011-02-11 DIAGNOSIS — O99330 Smoking (tobacco) complicating pregnancy, unspecified trimester: Secondary | ICD-10-CM

## 2011-02-11 DIAGNOSIS — Z3480 Encounter for supervision of other normal pregnancy, unspecified trimester: Secondary | ICD-10-CM

## 2011-02-11 LAB — POCT URINALYSIS DIP (DEVICE)
Bilirubin Urine: NEGATIVE
Glucose, UA: NEGATIVE mg/dL
Hgb urine dipstick: NEGATIVE
Ketones, ur: NEGATIVE mg/dL
Nitrite: NEGATIVE
Specific Gravity, Urine: 1.015 (ref 1.005–1.030)
pH: 7 (ref 5.0–8.0)

## 2011-02-11 NOTE — Progress Notes (Signed)
Pulse 92.  No vaginal discharge.

## 2011-02-11 NOTE — Progress Notes (Signed)
Patient without complaint. [place] 2 days ago - EFW 3587g with AC >97%. Random blood sugar - 105 (2hrs after breakfast) Will obtain prenatal labs as we have not received it yet.  RTC 1 week.

## 2011-02-12 LAB — OBSTETRIC PANEL
Antibody Screen: NEGATIVE
Basophils Relative: 0 % (ref 0–1)
Eosinophils Absolute: 0.1 10*3/uL (ref 0.0–0.7)
HCT: 38.4 % (ref 36.0–46.0)
Hemoglobin: 13.1 g/dL (ref 12.0–15.0)
Hepatitis B Surface Ag: NEGATIVE
Lymphs Abs: 2.4 10*3/uL (ref 0.7–4.0)
MCH: 28.4 pg (ref 26.0–34.0)
MCHC: 34.1 g/dL (ref 30.0–36.0)
Monocytes Absolute: 0.7 10*3/uL (ref 0.1–1.0)
Monocytes Relative: 5 % (ref 3–12)
Neutro Abs: 10.3 10*3/uL — ABNORMAL HIGH (ref 1.7–7.7)
Neutrophils Relative %: 76 % (ref 43–77)
RBC: 4.61 MIL/uL (ref 3.87–5.11)
Rh Type: POSITIVE

## 2011-02-14 ENCOUNTER — Inpatient Hospital Stay (HOSPITAL_COMMUNITY)
Admission: AD | Admit: 2011-02-14 | Discharge: 2011-02-16 | DRG: 775 | Disposition: A | Discharge status: Home / Self Care | Payer: Medicaid Other | Source: Ambulatory Visit | Attending: Obstetrics & Gynecology | Admitting: Obstetrics & Gynecology

## 2011-02-14 ENCOUNTER — Encounter (HOSPITAL_COMMUNITY): Payer: Self-pay

## 2011-02-14 DIAGNOSIS — Z3480 Encounter for supervision of other normal pregnancy, unspecified trimester: Secondary | ICD-10-CM

## 2011-02-14 DIAGNOSIS — O99330 Smoking (tobacco) complicating pregnancy, unspecified trimester: Secondary | ICD-10-CM

## 2011-02-14 LAB — CBC
MCH: 28.2 pg (ref 26.0–34.0)
MCV: 81 fL (ref 78.0–100.0)
Platelets: 294 10*3/uL (ref 150–400)
RDW: 13.9 % (ref 11.5–15.5)

## 2011-02-14 MED ORDER — OXYTOCIN 20 UNITS IN LACTATED RINGERS INFUSION - SIMPLE
125.0000 mL/h | Freq: Once | INTRAVENOUS | Status: DC
  Administered 2011-02-15: 125 mL/h via INTRAVENOUS

## 2011-02-14 MED ORDER — FLEET ENEMA 7-19 GM/118ML RE ENEM: 1.0000 | ENEMA | RECTAL | Status: DC | PRN

## 2011-02-14 MED ORDER — OXYCODONE-ACETAMINOPHEN 5-325 MG PO TABS: 2.0000 | ORAL_TABLET | ORAL | Status: DC | PRN

## 2011-02-14 MED ORDER — LACTATED RINGERS IV SOLN: 500.0000 mL | INTRAVENOUS | Status: DC | PRN

## 2011-02-14 MED ORDER — LIDOCAINE HCL (PF) 1 % IJ SOLN
30.0000 mL | INTRAMUSCULAR | Status: DC | PRN
  Administered 2011-02-15: 30 mL via SUBCUTANEOUS
  Filled 2011-02-14: qty 30

## 2011-02-14 MED ORDER — LACTATED RINGERS IV SOLN
INTRAVENOUS | Status: DC
  Administered 2011-02-14: 21:00:00 via INTRAVENOUS

## 2011-02-14 MED ORDER — ACETAMINOPHEN 325 MG PO TABS: 650.0000 mg | ORAL_TABLET | ORAL | Status: DC | PRN

## 2011-02-14 MED ORDER — OXYTOCIN BOLUS FROM INFUSION
500.0000 mL | Freq: Once | INTRAVENOUS | Status: DC
  Filled 2011-02-14: qty 500
  Filled 2011-02-14: qty 1000

## 2011-02-14 MED ORDER — CITRIC ACID-SODIUM CITRATE 334-500 MG/5ML PO SOLN: 30.0000 mL | ORAL | Status: DC | PRN

## 2011-02-14 MED ORDER — IBUPROFEN 600 MG PO TABS: 600.0000 mg | ORAL_TABLET | Freq: Four times a day (QID) | ORAL | Status: DC | PRN

## 2011-02-14 MED ORDER — ONDANSETRON HCL 4 MG/2ML IJ SOLN: 4.0000 mg | Freq: Four times a day (QID) | INTRAMUSCULAR | Status: DC | PRN

## 2011-02-14 MED ORDER — NALBUPHINE SYRINGE 5 MG/0.5 ML
10.0000 mg | INJECTION | INTRAMUSCULAR | Status: DC | PRN
  Administered 2011-02-14 – 2011-02-15 (×2): 10 mg via INTRAVENOUS
  Filled 2011-02-14 (×2): qty 1

## 2011-02-14 NOTE — H&P (Signed)
Mindy Bradford is a 25 y.o. female presenting for labor. Maternal Medical History:  Reason for admission: Reason for admission: contractions.  Contractions: Onset was 3-5 hours ago.   Frequency: regular.   Perceived severity is moderate.    Fetal activity: Perceived fetal activity is normal.   Last perceived fetal movement was within the past hour.      OB History    Grav Para Term Preterm Abortions TAB SAB Ect Mult Living   2 1 1       1      Past Medical History  Diagnosis Date  . 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 Leep 5 years ago    normal results  . Cholecystectomy 4 years ago   Family History: family history includes Asthma in her mother; Cancer in an unspecified family member; Diabetes in an unspecified family member; Heart disease in an unspecified family member; Hyperlipidemia in an unspecified family member; and Hypertension in an unspecified family member. Social History:  reports that she has been smoking Cigarettes.  She has a .8 pack-year smoking history. She has never used smokeless tobacco. She reports that she does not drink alcohol or use illicit drugs.  Review of Systems  Constitutional: Negative.   HENT: Negative.   Eyes: Negative.   Respiratory: Negative.   Cardiovascular: Negative.   Gastrointestinal: Negative.   Genitourinary: Negative.   Musculoskeletal: Negative.   Skin: Negative.   Neurological: Negative.   Endo/Heme/Allergies: Negative.   Psychiatric/Behavioral: Negative.     Dilation: 4 Effacement (%): 100 Station: -1 Exam by::  KIM BOOKER RN  Blood pressure 152/85, temperature 98.2 F (36.8 C), resp. rate 20, height 5\' 7"  (1.702 m), weight 113.399 kg (250 lb). Maternal Exam:  Uterine Assessment: Contraction strength is moderate.  Contraction frequency is regular.   Abdomen: Patient reports no abdominal tenderness. Fetal presentation:  vertex     Physical Exam  Constitutional: She is oriented to person, place, and time. She appears well-developed and well-nourished.  HENT:  Head: Normocephalic.  Neck: Normal range of motion.  Cardiovascular: Normal rate, regular rhythm, normal heart sounds and intact distal pulses.   Respiratory: Effort normal and breath sounds normal.  GI: Soft. Bowel sounds are normal.  Genitourinary: Vagina normal and uterus normal.  Musculoskeletal: Normal range of motion.  Neurological: She is alert and oriented to person, place, and time. She has normal reflexes.  Skin: Skin is warm and dry.  Psychiatric: She has a normal mood and affect. Her behavior is normal. Judgment and thought content normal.    Prenatal labs: ABO, Rh: A/POS/-- (09/06 0933) Antibody: NEG (09/06 0933) Rubella:   RPR: NON REAC (09/06 0933)  HBsAg: NEGATIVE (09/06 0933)  HIV:    GBS: NEGATIVE (08/23 0957)   Assessment/Plan: Admit in early labor.   [name] 02/14/2011, 8:28 PM

## 2011-02-14 NOTE — Plan of Care (Signed)
Problem: Consults Goal: Birthing Suites Patient Information Press F2 to bring up selections list  Outcome: Completed/Met Date Met:  02/14/11  Pt 37-[redacted] weeks EGA     

## 2011-02-15 ENCOUNTER — Encounter (HOSPITAL_COMMUNITY): Payer: Self-pay

## 2011-02-15 LAB — RAPID HIV SCREEN (WH-MAU): Rapid HIV Screen: NONREACTIVE

## 2011-02-15 LAB — RPR: RPR Ser Ql: NONREACTIVE

## 2011-02-15 MED ORDER — BENZOCAINE-MENTHOL 20-0.5 % EX AERO
1.0000 "application " | INHALATION_SPRAY | CUTANEOUS | Status: DC | PRN
  Administered 2011-02-15: 1 via TOPICAL

## 2011-02-15 MED ORDER — ZOLPIDEM TARTRATE 5 MG PO TABS: 5.0000 mg | ORAL_TABLET | Freq: Every evening | ORAL | Status: DC | PRN

## 2011-02-15 MED ORDER — LANOLIN HYDROUS EX OINT: TOPICAL_OINTMENT | CUTANEOUS | Status: DC | PRN

## 2011-02-15 MED ORDER — ONDANSETRON HCL 4 MG/2ML IJ SOLN: 4.0000 mg | INTRAMUSCULAR | Status: DC | PRN

## 2011-02-15 MED ORDER — TERBUTALINE SULFATE 1 MG/ML IJ SOLN: 0.2500 mg | Freq: Once | INTRAMUSCULAR | Status: DC | PRN

## 2011-02-15 MED ORDER — WITCH HAZEL-GLYCERIN EX PADS
1.0000 "application " | MEDICATED_PAD | CUTANEOUS | Status: DC | PRN
  Administered 2011-02-15: 1 via TOPICAL

## 2011-02-15 MED ORDER — BENZOCAINE-MENTHOL 20-0.5 % EX AERO
INHALATION_SPRAY | CUTANEOUS | Status: AC
  Filled 2011-02-15: qty 56

## 2011-02-15 MED ORDER — SIMETHICONE 80 MG PO CHEW: 80.0000 mg | CHEWABLE_TABLET | ORAL | Status: DC | PRN

## 2011-02-15 MED ORDER — SENNOSIDES-DOCUSATE SODIUM 8.6-50 MG PO TABS: 2.0000 | ORAL_TABLET | Freq: Every day | ORAL | Status: DC

## 2011-02-15 MED ORDER — ONDANSETRON HCL 4 MG PO TABS: 4.0000 mg | ORAL_TABLET | ORAL | Status: DC | PRN

## 2011-02-15 MED ORDER — DIBUCAINE 1 % RE OINT: 1.0000 "application " | TOPICAL_OINTMENT | RECTAL | Status: DC | PRN

## 2011-02-15 MED ORDER — PRENATAL PLUS 27-1 MG PO TABS
1.0000 | ORAL_TABLET | Freq: Every day | ORAL | Status: DC
  Administered 2011-02-15 – 2011-02-16 (×2): 1 via ORAL
  Filled 2011-02-15 (×2): qty 1

## 2011-02-15 MED ORDER — OXYTOCIN 20 UNITS IN LACTATED RINGERS INFUSION - SIMPLE
1.0000 m[IU]/min | INTRAVENOUS | Status: DC
  Administered 2011-02-15: 2 m[IU]/min via INTRAVENOUS

## 2011-02-15 MED ORDER — OXYCODONE-ACETAMINOPHEN 5-325 MG PO TABS
1.0000 | ORAL_TABLET | ORAL | Status: DC | PRN
  Administered 2011-02-16: 2 via ORAL
  Administered 2011-02-16: 1 via ORAL
  Filled 2011-02-15: qty 2
  Filled 2011-02-15: qty 1

## 2011-02-15 MED ORDER — TETANUS-DIPHTH-ACELL PERTUSSIS 5-2.5-18.5 LF-MCG/0.5 IM SUSP
0.5000 mL | Freq: Once | INTRAMUSCULAR | Status: AC
  Administered 2011-02-16: 0.5 mL via INTRAMUSCULAR
  Filled 2011-02-15: qty 0.5

## 2011-02-15 MED ORDER — IBUPROFEN 600 MG PO TABS
600.0000 mg | ORAL_TABLET | Freq: Four times a day (QID) | ORAL | Status: DC
  Administered 2011-02-15 – 2011-02-16 (×5): 600 mg via ORAL
  Filled 2011-02-15 (×5): qty 1

## 2011-02-15 MED ORDER — DIPHENHYDRAMINE HCL 25 MG PO CAPS: 25.0000 mg | ORAL_CAPSULE | Freq: Four times a day (QID) | ORAL | Status: DC | PRN

## 2011-02-15 NOTE — Progress Notes (Signed)
Mindy Bradford is a 25 y.o. G2P1001 at [redacted]w[redacted]d by ultrasound admitted for active labor  Subjective:   Objective: BP 135/74  Pulse 97  Temp(Src) 97.6 F (36.4 C) (Oral)  Resp 20  Ht 5\' 7"  (1.702 m)  Wt 113.399 kg (250 lb)  BMI 39.16 kg/m2      FHT:  FHR: 120 bpm, variability: moderate,  accelerations:  Present,  decelerations:  Present mild variables UC:   regular, every 3-5 minutes SVE:   Dilation: 5.5 Effacement (%): 100 Station: -1 Exam by:: Ansah-mensah, rnc  Labs: Lab Results  Component Value Date   WBC 17.4* 02/14/2011   HGB 12.6 02/14/2011   HCT 36.2 02/14/2011   MCV 81.0 02/14/2011   PLT 294 02/14/2011    Assessment / Plan: Spontaneous labor, inadequate UC's Labor: progressing slowly, will augment with pitocin Preeclampsia:  no signs or symptoms of toxicity Fetal Wellbeing:  Category II Pain Control:  nubain I/D:  n/a Anticipated MOD:  NSVD  Mindy Bradford 02/15/2011, 12:29 AM

## 2011-02-15 NOTE — Progress Notes (Signed)
UR chart review completed.  

## 2011-02-15 NOTE — H&P (Signed)
Agree with above note.  Mindy Bole H. 02/15/2011 5:51 AM

## 2011-02-16 MED ORDER — IBUPROFEN 600 MG PO TABS: 600.0000 mg | ORAL_TABLET | Freq: Four times a day (QID) | ORAL | Status: AC

## 2011-02-16 MED ORDER — INFLUENZA VIRUS VACC SPLIT PF IM SUSP
0.5000 mL | Freq: Once | INTRAMUSCULAR | Status: AC
  Administered 2011-02-16: 0.5 mL via INTRAMUSCULAR
  Filled 2011-02-16: qty 0.5

## 2011-02-16 NOTE — Progress Notes (Signed)
Post Partum Day 1 Subjective: Patient reports she is doing well and has no complaints.  Pain is well controlled with PO medication.  Up and ambulating well.  +BM and voiding well. Tolerating PO, denies n/v.  Lochia decreased since yesterday, slightly more than menses. Currently bottle feeding but considering breastfeeding.  Unsure of contraceptive method.  Requesting discharge today.  Objective: Blood pressure 119/73, pulse 84, temperature 97.9 F (36.6 C), temperature source Oral, resp. rate 20, height 5\' 7"  (1.702 m), weight 250 lb (113.399 kg), unknown if currently breastfeeding.  Physical Exam:  General: alert, cooperative and no distress Lochia: appropriate Uterine Fundus: Firm and below umbilicus Incision: None DVT Evaluation: No evidence of DVT seen on physical exam. Negative Homan's sign. No significant calf/ankle edema.   Basename 02/14/11 2120  HGB 12.6  HCT 36.2    Assessment/Plan: Ms. Summons is a 25 yo G2P2 ppd1 s/p NSVD who is doing well.  Rh+/Rubella Immune/ GBS Neg.  Unsure of contraceptive method, deciding between Depo and Implanon 1) Discharge to home today 2) Motrin for pain 3) Contraception discussed 4) Follow up to [facility]   LOS: 2 days   Esaw Dace PA-S 02/16/2011, 7:48 AM

## 2011-02-16 NOTE — Discharge Summary (Signed)
Obstetric Discharge Summary Reason for Admission: onset of labor Prenatal Procedures: ultrasound Intrapartum Procedures: spontaneous vaginal delivery Postpartum Procedures: none Complications-Operative and Postpartum: none Hemoglobin  Date Value Range Status  02/14/2011 12.6  12.0-15.0 (g/dL) Final     HCT  Date Value Range Status  02/14/2011 36.2  36.0-46.0 (%) Final    Discharge Diagnoses: Term Pregnancy-delivered  Discharge Information: Date: 02/16/2011 Activity: pelvic rest Diet: routine Medications: PNV and Ibuprophen Condition: stable Instructions: refer to practice specific booklet Discharge to: home Follow-up Information    Call [facility]. (4-6 weeks postpartum visit)    Contact information:   [address] [place] [address]          Newborn Data: Live born female  Birth Weight: 7 lb 0.2 oz (3180 g) APGAR: 9, 9  Home with mother.  Adam Chenevert PA-S 02/16/2011, 8:36 AM

## 2011-02-16 NOTE — Progress Notes (Signed)
Agree with above PA note.   Objective:  Blood pressure 119/73, pulse 84, temperature 97.9 F (36.6 C), temperature source Oral, resp. rate 20, height 5\' 7"  (1.702 m), weight 250 lb (113.399 kg), unknown if currently breastfeeding.  Physical Exam:  General: alert, cooperative and no distress  Lochia: appropriate  Uterine Fundus: Firm and below umbilicus  Incision: None  DVT Evaluation: No evidence of DVT seen on physical exam.  Negative Homan's sign.  No significant calf/ankle edema.   Basename  02/14/11 2120   HGB  12.6   HCT  36.2    Assessment/Plan:  Mindy Bradford is a 25 yo G2P2 ppd1 s/p NSVD who is doing well. Rh+/Rubella Immune/ GBS Neg. Patient would like Nexplanon at 6 week f/u if medicaid will pay for it.  1) Discharge to home today  2) Pain control: motrin 3) Contraception: Nexplanon 4) Follow up to [facility]  LOS: 2 days

## 2011-02-18 NOTE — Telephone Encounter (Signed)
error 

## 2011-02-21 NOTE — Discharge Summary (Signed)
Pt seen with residents--Drs. Elwyn Reach and [employer]. Agree with above note.  Kieu Quiggle H. 02/21/2011 8:06 PM

## 2011-03-19 ENCOUNTER — Encounter: Payer: Self-pay | Admitting: Advanced Practice Midwife

## 2011-03-19 ENCOUNTER — Ambulatory Visit (INDEPENDENT_AMBULATORY_CARE_PROVIDER_SITE_OTHER): Payer: Medicaid Other | Admitting: Advanced Practice Midwife

## 2011-03-19 DIAGNOSIS — E669 Obesity, unspecified: Secondary | ICD-10-CM

## 2011-03-19 DIAGNOSIS — O99215 Obesity complicating the puerperium: Secondary | ICD-10-CM

## 2011-03-19 DIAGNOSIS — O99335 Smoking (tobacco) complicating the puerperium: Secondary | ICD-10-CM

## 2011-03-19 DIAGNOSIS — O99330 Smoking (tobacco) complicating pregnancy, unspecified trimester: Secondary | ICD-10-CM

## 2011-03-19 MED ORDER — DESOGESTREL-ETHINYL ESTRADIOL 0.15-0.02/0.01 MG (21/5) PO TABS: 1.0000 | ORAL_TABLET | Freq: Every day | ORAL | Status: DC

## 2011-03-19 NOTE — Progress Notes (Signed)
  Subjective:    Patient ID: Mindy Bradford, female    DOB: August 17, 1985, 25 y.o.   MRN: [id number]  HPI Presents for postpartum exam. Has done well. Bled for only 1.5 weeks. No sutures with delivery. Has not had intercourse yet. Desires to use OCPs. Is Bottle feeding. No postpartum depression.  Smokes less than half pack per day, wants to quit. Wants to know how to lose weight.  Review of Systems  Constitutional: Negative for fever and appetite change.  Genitourinary: Negative for vaginal bleeding, vaginal discharge, difficulty urinating and vaginal pain.       Objective:   Physical Exam  Constitutional: She is oriented to person, place, and time. She appears well-developed and well-nourished.  HENT:  Head: Normocephalic.  Pulmonary/Chest: Effort normal.  Abdominal: Soft. There is no tenderness. There is no rebound and no guarding.  Genitourinary: Vagina normal and uterus normal. No vaginal discharge found.       Uterus well involuted.  Musculoskeletal: Normal range of motion. She exhibits no edema.  Neurological: She is alert and oriented to person, place, and time.  Skin: Skin is warm and dry.  Psychiatric: She has a normal mood and affect.          Assessment & Plan:  A:  Normal postpartum exam Bottle feeding Desires to quit smoking Desires to lose weight  P:  Rx Mircette (generic). Cautioned against VTE and reinforced significant increased risk with smoking. Reviewed signs of VTE and to come to ER immediately if has them  Counseled re: stopping smoking  Counseled re: weight loss including diet changes and exercise.  Return in Feb for annual exam

## 2011-03-19 NOTE — Assessment & Plan Note (Signed)
Wants to quit. Counseled on quitting. Tips given.

## 2011-07-30 ENCOUNTER — Telehealth: Payer: Self-pay | Admitting: *Deleted

## 2011-07-30 DIAGNOSIS — N939 Abnormal uterine and vaginal bleeding, unspecified: Secondary | ICD-10-CM

## 2011-07-30 MED ORDER — MEDROXYPROGESTERONE ACETATE 5 MG PO TABS: 5.0000 mg | ORAL_TABLET | Freq: Every day | ORAL | Status: DC

## 2011-07-30 NOTE — Telephone Encounter (Signed)
Spoke with Dr Jolayne Panther regarding patients problems. She advised that we call in Provera 5mg  daily for 7 days. Pt was advised that she would bleed when course of provera ended. If patients period in march is long she will need to be seen. Pt has an appt scheduled for March 20 for an annual. She will keep this appt. Pt agrees with plan.

## 2011-07-30 NOTE — Telephone Encounter (Signed)
Pt left message stating that she has been having her period for 2 weeks, doesn't  know what she should do.

## 2011-08-25 ENCOUNTER — Ambulatory Visit: Payer: Medicaid Other | Admitting: Obstetrics and Gynecology

## 2011-08-27 ENCOUNTER — Other Ambulatory Visit (HOSPITAL_COMMUNITY)
Admission: RE | Admit: 2011-08-27 | Discharge: 2011-08-27 | Disposition: A | Discharge status: Home / Self Care | Payer: Medicaid Other | Source: Ambulatory Visit | Attending: Obstetrics and Gynecology | Admitting: Obstetrics and Gynecology

## 2011-08-27 ENCOUNTER — Ambulatory Visit (INDEPENDENT_AMBULATORY_CARE_PROVIDER_SITE_OTHER): Payer: Self-pay | Admitting: Physician Assistant

## 2011-08-27 ENCOUNTER — Encounter: Payer: Self-pay | Admitting: Physician Assistant

## 2011-08-27 VITALS — BP 120/73 | HR 91 | Temp 97.0°F | Resp 16 | Ht 67.0 in | Wt 254.9 lb

## 2011-08-27 DIAGNOSIS — Z01419 Encounter for gynecological examination (general) (routine) without abnormal findings: Secondary | ICD-10-CM

## 2011-08-27 DIAGNOSIS — F17200 Nicotine dependence, unspecified, uncomplicated: Secondary | ICD-10-CM | POA: Insufficient documentation

## 2011-08-27 DIAGNOSIS — Z30431 Encounter for routine checking of intrauterine contraceptive device: Secondary | ICD-10-CM

## 2011-08-27 DIAGNOSIS — Z309 Encounter for contraceptive management, unspecified: Secondary | ICD-10-CM

## 2011-08-27 NOTE — Patient Instructions (Signed)
Contraception Choices Birth control (contraception) can stop pregnancy from happening. Different types of birth control work in different ways. Some can:  Make the mucus in the cervix thick. This makes it hard for sperm to get into the uterus.   Thin the lining of the uterus. This makes it hard for an egg to attach to the wall of the uterus.   Stop the ovaries from releasing an egg.   Block the sperm from reaching the egg.  Certain types of surgery can stop pregnancy from happening. For women, the sugery closes the fallopian tubes (tubal ligation). For men, the surgery stops sperm from releasing during sex (vasectomy). HORMONAL BIRTH CONTROL Hormonal birth control stops pregnancy by putting hormones into your body. Types of birth control include:  A small tube put under the skin of the upper arm (implant). The tube can stay in place for 3 years.   Shots given every 3 months.   Pills taken every day or once after sex (intercourse).   Patches that are changed once a week.   A ring put into the vagina (vaginal ring). The ring is left in place for 3 weeks and removed for 1 week. Then, a new ring is put in the vagina.  BARRIER BIRTH CONTROL  Barrier birth control blocks sperm from reaching the egg. Types of birth control include:   A thin covering worn on the penis (female condom) during sex.   A soft, loose covering put into the vagina (female condom) before sex.   A rubber bowl that sits over the cervix (diaphragm). The bowl must be made for you. The bowl is put into the vagina before sex. The bowl is left in place for 6 to 8 hours after sex.   A small, soft cup that fits over the cervix (cervical cap). The cup must be made for you. The cup can be left in place for 48 hours after sex.   A sponge that is put into the vagina before sex.   A chemical that kills or blocks sperm from getting into the cervix and uterus (spermicide). The chemical may be a cream, jelly, foam, or pill.    INTRAUTERINE (IUD) BIRTH CONTROL  IUD birth control is a small, T-shaped piece of plastic. The plastic is put inside the uterus. There are 2 types of IUD:  Copper IUD. The IUD is covered in copper wire. The copper makes a fluid that kills sperm. It can stay in place for 10 years.   Hormone IUD. The hormone stops pregnancy from happening. It can stay in place for 5 years.  NATURAL FAMILY PLANNING BIRTH CONTROL  Natural family planning means not having sex or using barrier birth control when the woman is fertile. A woman can:  Use a calendar to keep track of when she is fertile.   Use a thermometer to measure her body temperature.  Protect yourself against sexual diseases no matter what type of birth control you use. Talk to your doctor about which type of birth control is best for you. Document Released: 03/21/2009 Document Revised: 05/13/2011 Document Reviewed: 09/30/2010 [facility] Patient Information 2012 [place], [place].Intrauterine Device Information An intrauterine device (IUD) is inserted into your uterus and prevents pregnancy. There are 2 types of IUDs available:  Copper IUD. This type of IUD is wrapped in copper wire and is placed inside the uterus. Copper makes the uterus and fallopian tubes produce a fluid that kills sperm. The copper IUD can stay in place for 10 years.  Hormone IUD. This type of IUD contains the hormone progestin (synthetic progesterone). The hormone thickens the cervical mucus and prevents sperm from entering the uterus, and it also thins the uterine lining to prevent implantation of a fertilized egg. The hormone can weaken or kill the sperm that get into the uterus. The hormone IUD can stay in place for 5 years.  Your caregiver will make sure you are a good candidate for a contraceptive IUD. Discuss with your caregiver the possible side effects. ADVANTAGES  It is highly effective, reversible, long-acting, and low maintenance.   There are no estrogen-related  side effects.   An IUD can be used when breastfeeding.   It is not associated with weight gain.   It works immediately after insertion.   The copper IUD does not interfere with your female hormones.   The progesterone IUD can make heavy menstrual periods lighter.   The progesterone IUD can be used for 5 years.   The copper IUD can be used for 10 years.  DISADVANTAGES  The progesterone IUD can be associated with irregular bleeding patterns.   The copper IUD can make your menstrual flow heavier and more painful.   You may experience cramping and vaginal bleeding after insertion.  Document Released: 04/27/2004 Document Revised: 05/13/2011 Document Reviewed: 09/26/2010 [employer] [facility] Patient Information 2012 [place], [place].

## 2011-08-27 NOTE — Progress Notes (Signed)
Chief Complaint:  Gynecologic Exam   Mindy Bradford is  26 y.o. [id number].  Patient's last menstrual period was 07/16/2011..  She presents complaining of Gynecologic Exam  No complaints. Reports hx abnormal pap in pregnancy without follow-up. Desires long-term contraception.  Obstetrical/Gynecological History: OB History    Grav Para Term Preterm Abortions TAB SAB Ect Mult Living   2 2 2       2       Past Medical History: Past Medical History  Diagnosis Date  . 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 Leep 5 years ago    normal results  . Cholecystectomy 2008  . Wisdom tooth extraction     age 83    Family History: Family History  Problem Relation Age of Onset  . Asthma Mother   . Heart disease    . Cancer    . Diabetes    . Hyperlipidemia    . Hypertension    . Asthma Sister   . ADD / ADHD Sister   . ADD / ADHD Brother     Social History: History  Substance Use Topics  . Smoking status: Current Everyday Smoker -- 0.2 packs/day for 8 years    Types: Cigarettes  . Smokeless tobacco: Never Used  . Alcohol Use: No    Allergies:  Allergies  Allergen Reactions  . Sulfa Antibiotics Rash     Review of Systems - Negative except what has been reviewed in HPI  Physical Exam   Blood pressure 120/73, pulse 91, temperature 97 F (36.1 C), temperature source Oral, resp. rate 16, height 5\' 7"  (1.702 m), weight 254 lb 14.4 oz (115.622 kg), last menstrual period 07/16/2011, not currently breastfeeding.  General: General appearance - alert, well appearing, and in no distress, oriented to person, place, and time and overweight Mental status - alert, oriented to person, place, and time, normal mood, behavior, speech, dress, motor activity, and thought processes, affect appropriate to mood Eyes - left eye normal, right eye normal Mouth - mucous membranes moist, pharynx  normal without lesions Neck - supple, no significant adenopathy Abdomen - soft, nontender, nondistended, no masses or organomegaly Breasts - right breast normal without mass, skin or nipple changes or axillary nodes, left breast normal without mass, skin or nipple changes or axillary nodes Focused Gynecological Exam: VULVA: normal appearing vulva with no masses, tenderness or lesions, VAGINA: normal appearing vagina with normal color and discharge, no lesions, CERVIX: normal appearing cervix without discharge or lesions, friable with collection of pap, UTERUS: uterus is normal size, shape, consistency and nontender, ADNEXA: normal adnexa in size, nontender and no masses   Assessment: 1. Routine gynecological examination  Cytology - PAP, POCT urine pregnancy  2. Contraception management  Cytology - PAP, POCT urine pregnancy  3. Smoker unmotivated to quit       Plan: Discussed long-term options for contraception. Pt desires Mirena. Given hx of abnormal pap in past without follow-up, recommend Depo in interim since pt may need colpo/bx/intervention. Pt agrees. If pap NL, will proceed with Mirena. If abnormal, pt to receive Depo in 2 weeks.  Darnell Jeschke E. 08/27/2011,11:37 AM

## 2011-08-30 LAB — POCT PREGNANCY, URINE: Preg Test, Ur: NEGATIVE

## 2011-09-20 ENCOUNTER — Ambulatory Visit (INDEPENDENT_AMBULATORY_CARE_PROVIDER_SITE_OTHER): Payer: Self-pay | Admitting: Physician Assistant

## 2011-09-20 ENCOUNTER — Encounter: Payer: Self-pay | Admitting: Physician Assistant

## 2011-09-20 VITALS — BP 121/77 | HR 92 | Temp 98.7°F | Ht 67.0 in | Wt 256.5 lb

## 2011-09-20 DIAGNOSIS — Z309 Encounter for contraceptive management, unspecified: Secondary | ICD-10-CM

## 2011-09-20 NOTE — Patient Instructions (Signed)
Contraceptive Implant Information A contraceptive implant is a plastic rod that is inserted under the skin. It is usually inserted under the skin of your upper arm. It continually releases small amounts of progestin (synthetic progesterone) into the bloodstream. This prevents an egg from being released from the ovary. It also thickens the cervical mucus to prevent sperm from entering the cervix, and it thins the uterine lining to prevent a fertilized egg from attaching to the uterus. They can be effective for up to 3 years. Implants do not provide protection against sexually transmitted diseases (STDs).  The procedure to insert an implant usually takes about 10 minutes. There may be minor bruising, swelling, and discomfort at the insertion site for a couple days. The implant begins to work within the first day. Other contraceptive protection should be used for 2 weeks. Follow up with your caregiver to get rechecked as directed. Your caregiver will make sure you are a good candidate for the contraceptive implant. Discuss with your caregiver the possible side effects of the implant ADVANTAGES  It prevents pregnancy for up to 3 years.   It is easily reversible.   It is convenient.   The progestins may protect against uterine and ovarian cancer.   It can be used when breastfeeding.   It can be used by women who cannot take estrogen.  DISADVANTAGES  You may have irregular or unplanned vaginal bleeding.   You may develop side effects, including headache, weight gain, acne, breast tenderness, or mood changes.   You may have tissue or nerve damage after insertion (rare).   It may be difficult and uncomfortable to remove.   Certain medications may interfere with the effectiveness of the implants.  REMOVAL OF IMPLANT The implant should be removed in 3 years or as directed by your caregiver. The implants effect wears off in a few hours after removal. Your ability to get pregnant (fertility) is  restored within a couple of weeks. New implants can be inserted as soon as the old ones are removed if desired. DO NOT GET THE IMPLANT IF:   You are pregnant.   You have a history of breast cancer, osteoporosis, blood clots, heart disease, diabetes, high blood pressure, liver disease, tumors, or stroke.    You have undiagnosed vaginal bleeding.   You have overly sensitive to certain parts of the implant.  Document Released: 05/13/2011 Document Reviewed: 05/11/2011 [employer] Patient Information 2012 [employer]. 

## 2011-09-20 NOTE — Progress Notes (Signed)
Chief Complaint:  Follow-up and Contraception   Mindy Bradford is  26 y.o. [id number].  Patient's last menstrual period was 09/09/2011..  She presents complaining of Follow-up and Contraception  Presents for results and to discuss contraception. Desires Nexplanon.  Obstetrical/Gynecological History: OB History    Grav Para Term Preterm Abortions TAB SAB Ect Mult Living   2 2 2       2       Past Medical History: Past Medical History  Diagnosis Date  . 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 Leep 5 years ago    normal results  . Cholecystectomy 2008  . Wisdom tooth extraction     age 40    Family History: Family History  Problem Relation Age of Onset  . Asthma Mother   . Heart disease    . Cancer    . Diabetes    . Hyperlipidemia    . Hypertension    . Asthma Sister   . ADD / ADHD Sister   . ADD / ADHD Brother     Social History: History  Substance Use Topics  . Smoking status: Current Everyday Smoker -- 0.2 packs/day for 8 years    Types: Cigarettes  . Smokeless tobacco: Never Used  . Alcohol Use: No    Allergies:  Allergies  Allergen Reactions  . Sulfa Antibiotics Rash     (Not in a hospital admission)  Review of Systems - Negative except what has been reviewed in HPI  Physical Exam   Blood pressure 121/77, pulse 92, temperature 98.7 F (37.1 C), temperature source Oral, height 5\' 7"  (1.702 m), weight 256 lb 8 oz (116.348 kg), last menstrual period 09/09/2011, not currently breastfeeding.  General: General appearance - alert, well appearing, and in no distress, oriented to person, place, and time and overweight Mental status - alert, oriented to person, place, and time, normal mood, behavior, speech, dress, motor activity, and thought processes, affect appropriate to mood Focused Gynecological Exam: examination not indicated  Labs:  08/27/2011 Adequacy Reason Satisfactory for evaluation, endocervical/transformation zone component PRESENT. Diagnosis NEGATIVE FOR INTRAEPITHELIAL LESIONS OR MALIGNANCY.  Assessment: 1. Contraception management      Plan: Reviewed NL pap results. BC options reviewed. Pt desires Nexplanon. Barrier method recommended until nexplanon inserted.  RTC next available Nexplanon appt slot Pap due 08/2013  Boyd Buffalo E. 09/20/2011,1:12 PM

## 2012-11-27 ENCOUNTER — Encounter (HOSPITAL_COMMUNITY): Payer: Self-pay | Admitting: *Deleted

## 2012-11-27 ENCOUNTER — Emergency Department (HOSPITAL_COMMUNITY): Payer: Self-pay

## 2012-11-27 ENCOUNTER — Emergency Department (HOSPITAL_COMMUNITY)
Admission: EM | Admit: 2012-11-27 | Discharge: 2012-11-27 | Disposition: A | Discharge status: Home / Self Care | Payer: Self-pay | Attending: Emergency Medicine | Admitting: Emergency Medicine

## 2012-11-27 DIAGNOSIS — F988 Other specified behavioral and emotional disorders with onset usually occurring in childhood and adolescence: Secondary | ICD-10-CM | POA: Insufficient documentation

## 2012-11-27 DIAGNOSIS — Z882 Allergy status to sulfonamides status: Secondary | ICD-10-CM | POA: Insufficient documentation

## 2012-11-27 DIAGNOSIS — L03032 Cellulitis of left toe: Secondary | ICD-10-CM

## 2012-11-27 DIAGNOSIS — L02619 Cutaneous abscess of unspecified foot: Secondary | ICD-10-CM | POA: Insufficient documentation

## 2012-11-27 DIAGNOSIS — F17200 Nicotine dependence, unspecified, uncomplicated: Secondary | ICD-10-CM | POA: Insufficient documentation

## 2012-11-27 DIAGNOSIS — L03039 Cellulitis of unspecified toe: Secondary | ICD-10-CM | POA: Insufficient documentation

## 2012-11-27 MED ORDER — TRAMADOL HCL 50 MG PO TABS
50.0000 mg | ORAL_TABLET | Freq: Once | ORAL | Status: AC
  Administered 2012-11-27: 50 mg via ORAL
  Filled 2012-11-27: qty 1

## 2012-11-27 MED ORDER — TRAMADOL HCL 50 MG PO TABS: 50.0000 mg | ORAL_TABLET | Freq: Four times a day (QID) | ORAL | Status: DC | PRN

## 2012-11-27 MED ORDER — CEPHALEXIN 500 MG PO CAPS: 500.0000 mg | ORAL_CAPSULE | Freq: Four times a day (QID) | ORAL | Status: DC

## 2012-11-27 NOTE — ED Provider Notes (Signed)
History     CSN: [id number]  Arrival date & time 11/27/12  1133   First MD Initiated Contact with Patient 11/27/12 1337      Chief Complaint  Patient presents with  . Toe Pain    (Consider location/radiation/quality/duration/timing/severity/associated sxs/prior treatment) HPI Comments: Patient is a 27 year old female who presents with left great toe pain for the past 2 days. Symptoms started gradually and progressively worsened since the onset. Patient reports clipping her toenail too short a few days before symptoms started. The pain is throbbing and severe without radiation. Palpation of the area makes the pain worse. Nothing makes the pain better. No associated symptoms.    Past Medical History  Diagnosis Date  . 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 Leep  5 years ago    normal results  . Cholecystectomy  2008  . Wisdom tooth extraction      age 51    Family History  Problem Relation Age of Onset  . Asthma Mother   . Heart disease    . Cancer    . Diabetes    . Hyperlipidemia    . Hypertension    . Asthma Sister   . ADD / ADHD Sister   . ADD / ADHD Brother     History  Substance Use Topics  . Smoking status: Current Every Day Smoker -- 0.25 packs/day for 8 years    Types: Cigarettes  . Smokeless tobacco: Never Used  . Alcohol Use: No    OB History   Grav Para Term Preterm Abortions TAB SAB Ect Mult Living   2 2 2       2       Review of Systems  Skin: Positive for wound.  All other systems reviewed and are negative.    Allergies  Sulfa antibiotics  Home Medications  No current outpatient prescriptions on file.  BP 125/60  Pulse 90  Temp(Src) 98.4 F (36.9 C) (Oral)  Resp 20  Wt 235 lb (106.595 kg)  BMI 36.8 kg/m2  SpO2 97%  LMP 11/06/2012  Physical Exam  Nursing note and vitals reviewed. Constitutional: She appears well-developed and  well-nourished. No distress.  HENT:  Head: Normocephalic and atraumatic.  Eyes: Conjunctivae are normal.  Neck: Normal range of motion.  Cardiovascular: Normal rate and regular rhythm.  Exam reveals no gallop and no friction rub.   No murmur heard. Pulmonary/Chest: Effort normal and breath sounds normal. She has no wheezes. She has no rales. She exhibits no tenderness.  Abdominal: Soft. There is no tenderness.  Musculoskeletal: Normal range of motion.  Patient has full ROM of left foot toes.   Neurological: She is alert.  Speech is goal-oriented. Moves limbs without ataxia.   Skin: Skin is warm and dry.  Skin surrounding left great toenail erythematous and tender to palpate. No open wound.   Psychiatric: She has a normal mood and affect. Her behavior is normal.    ED Course  Procedures (including critical care time)  Labs Reviewed - No data to display Dg Toe Great Left  11/27/2012   *RADIOLOGY REPORT*  Clinical Data: Great toe pain.  No known injury.  LEFT GREAT TOE  Comparison: None.  Findings: The mineralization and alignment are normal.  There is no evidence of acute fracture or dislocation.  There is mild metatarsal phalangeal and interphalangeal joint space loss.  No foreign bodies  or erosive change are identified.  IMPRESSION: No acute osseous findings.   Original Report Authenticated By: Carey Bullocks, M.D.     1. Cellulitis of great toe, left       MDM  2:07 PM Xray pending.   3:03 PM Xray unremarkable. Patient appears to have an ingrown toenail with surrounding cellulitis. Patient will have tramadol and bactrim and instructions to soak her foot in epsom salt or warm soap and water. Patient instructed to follow up with a doctor from the resource guide.      Emilia Beck, PA-C 11/28/12 1025

## 2012-11-27 NOTE — ED Notes (Signed)
Pt is here with left big toe pain and states that she has pain in it.  Pt states pain has increased since yesterday

## 2012-11-28 NOTE — ED Provider Notes (Signed)
Medical screening examination/treatment/procedure(s) were performed by non-physician practitioner and as supervising physician I was immediately available for consultation/collaboration.   Annora Guderian Y. Roslyn Else, MD 11/28/12 1048 

## 2013-02-07 ENCOUNTER — Emergency Department (HOSPITAL_COMMUNITY)
Admission: EM | Admit: 2013-02-07 | Discharge: 2013-02-07 | Disposition: A | Discharge status: Home / Self Care | Payer: Self-pay | Attending: Emergency Medicine | Admitting: Emergency Medicine

## 2013-02-07 ENCOUNTER — Encounter (HOSPITAL_COMMUNITY): Payer: Self-pay | Admitting: Emergency Medicine

## 2013-02-07 DIAGNOSIS — Z8659 Personal history of other mental and behavioral disorders: Secondary | ICD-10-CM | POA: Insufficient documentation

## 2013-02-07 DIAGNOSIS — H6120 Impacted cerumen, unspecified ear: Secondary | ICD-10-CM | POA: Insufficient documentation

## 2013-02-07 DIAGNOSIS — H6091 Unspecified otitis externa, right ear: Secondary | ICD-10-CM

## 2013-02-07 DIAGNOSIS — H60399 Other infective otitis externa, unspecified ear: Secondary | ICD-10-CM | POA: Insufficient documentation

## 2013-02-07 DIAGNOSIS — H6121 Impacted cerumen, right ear: Secondary | ICD-10-CM

## 2013-02-07 DIAGNOSIS — F17200 Nicotine dependence, unspecified, uncomplicated: Secondary | ICD-10-CM | POA: Insufficient documentation

## 2013-02-07 MED ORDER — CIPROFLOXACIN-HYDROCORTISONE 0.2-1 % OT SUSP: 3.0000 [drp] | Freq: Two times a day (BID) | OTIC | Status: DC

## 2013-02-07 NOTE — ED Notes (Signed)
Went swimming Saturday night. Woke Sunday am with right ear ache. States has decreased hearing in right ear.

## 2013-02-07 NOTE — ED Notes (Signed)
Pt sts right ear pain since swimming; large amount of cerumen noted bilaterally

## 2013-02-07 NOTE — ED Provider Notes (Signed)
CSN: [id number]     Arrival date & time 02/07/13  1627 History  This chart was scribed for non-physician practitioner, Dierdre Forth, PA-C, working with No att. providers found by Leone Payor, ED Scribe. This patient was seen in room TR04C/TR04C and the patient's care was started at 1627.   Chief Complaint  Patient presents with  . Otalgia  . Cerumen Impaction   The history is provided by the patient. No language interpreter was used.    HPI Comments: Mindy Bradford is a 27 y.o. female who presents to the Emergency Department complaining of constant, non-radiating, improved right ear pain that began 3 days ago after swimming. She reports having associated decreased hearing in the affected ear. She denies taking any OTC medication for this pain. She denies teeth pain, fever, chills, nausea, emesis.   Past Medical History  Diagnosis Date  . 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 Leep  5 years ago    normal results  . Cholecystectomy  2008  . Wisdom tooth extraction      age 30   Family History  Problem Relation Age of Onset  . Asthma Mother   . Heart disease    . Cancer    . Diabetes    . Hyperlipidemia    . Hypertension    . Asthma Sister   . ADD / ADHD Sister   . ADD / ADHD Brother    History  Substance Use Topics  . Smoking status: Current Every Day Smoker -- 0.25 packs/day for 8 years    Types: Cigarettes  . Smokeless tobacco: Never Used  . Alcohol Use: No   OB History   Grav Para Term Preterm Abortions TAB SAB Ect Mult Living   2 2 2       2      Review of Systems  Constitutional: Negative for fever and chills.  HENT: Positive for ear pain (right sided).   Gastrointestinal: Negative for nausea and vomiting.  All other systems reviewed and are negative.    Allergies  Sulfa antibiotics  Home Medications   Current Outpatient Rx  Name  Route  Sig  Dispense  Refill   . ciprofloxacin-hydrocortisone (CIPRO HC) otic suspension   Right Ear   Place 3 drops into the right ear 2 (two) times daily. For 10 days   10 mL   0     Triage Vitals: BP 121/66  Pulse 97  Temp(Src) 97.7 F (36.5 C) (Oral)  Resp 18  Ht 5\' 7"  (1.702 m)  Wt 248 lb (112.492 kg)  BMI 38.83 kg/m2  SpO2 98% Physical Exam  Nursing note and vitals reviewed. Constitutional: She is oriented to person, place, and time. She appears well-developed and well-nourished. No distress.  Awake, alert, nontoxic appearance  HENT:  Head: Atraumatic. Not macrocephalic.  Right Ear: External ear normal. Tympanic membrane is not erythematous.  Left Ear: Hearing, tympanic membrane, external ear and ear canal normal. Tympanic membrane is not erythematous and not bulging.  Nose: Nose normal. No mucosal edema or rhinorrhea. Right sinus exhibits no maxillary sinus tenderness and no frontal sinus tenderness. Left sinus exhibits no maxillary sinus tenderness and no frontal sinus tenderness.  Mouth/Throat: Uvula is midline and oropharynx is clear and moist. Mucous membranes are not dry. No edematous. No oropharyngeal exudate, posterior oropharyngeal edema, posterior oropharyngeal erythema or tonsillar abscesses.  Right ear with cerumen impaction of  the canal No pain to palpation of the mastoid  Eyes: Conjunctivae and EOM are normal. Pupils are equal, round, and reactive to light. No scleral icterus.  Neck: Normal range of motion and phonation normal. Neck supple. No spinous process tenderness and no muscular tenderness present. No rigidity. Normal range of motion present. No Brudzinski's sign and no Kernig's sign noted.  Cardiovascular: Normal rate, regular rhythm, normal heart sounds and intact distal pulses.   No murmur heard. Pulmonary/Chest: Effort normal and breath sounds normal. No respiratory distress. She has no wheezes.  Musculoskeletal: Normal range of motion. She exhibits no edema.  Lymphadenopathy:     She has no cervical adenopathy.  Neurological: She is alert and oriented to person, place, and time. Coordination normal.  Speech is clear and goal oriented Moves extremities without ataxia  Skin: Skin is warm and dry. No rash noted. She is not diaphoretic. No erythema.  Psychiatric: She has a normal mood and affect. Her behavior is normal.    ED Course  EAR CERUMEN REMOVAL Date/Time: 02/07/2013 5:45 PM Performed by: Dierdre Forth Authorized by: Dierdre Forth Consent: Verbal consent obtained. Risks and benefits: risks, benefits and alternatives were discussed Consent given by: patient Patient understanding: patient states understanding of the procedure being performed Patient consent: the patient's understanding of the procedure matches consent given Procedure consent: procedure consent matches procedure scheduled Relevant documents: relevant documents present and verified Site marked: the operative site was marked Required items: required blood products, implants, devices, and special equipment available Patient identity confirmed: verbally with patient and arm band Time out: Immediately prior to procedure a "time out" was called to verify the correct patient, procedure, equipment, support staff and site/side marked as required. Local anesthetic: none Location details: right ear Procedure type: irrigation Patient sedated: no Patient tolerance: Patient tolerated the procedure well with no immediate complications. Comments: Large amount of cerumen removal with irrigation without complication   (including critical care time)  DIAGNOSTIC STUDIES: Oxygen Saturation is 98% on RA, normal by my interpretation.    COORDINATION OF CARE:  5:45 PM Discussed treatment plan with pt at bedside and pt agreed to plan.  Labs Review Labs Reviewed - No data to display Imaging Review No results found.  MDM   1. Cerumen impaction, right   2. Otitis externa, right    Mindy Bradford presents with right otalgia after swimming and cerumen impaction on PE.  Cerumen removed with irrigation.   Right ear PE after cerumen removal: Canal is erythematous and edematous. Middle ear has a small effusion with clear fluid. TM non-erythematous.    Pt presenting with otitis externa after swimming. No canal occlusion, Pt afebrile in NAD. Exam non concerning for mastoiditis, cellulitis or malignant OE. No ear wick placement as swelling was not great enough to warrant this procedure. Dc with ciprofloxacin script.  Advised PCP follow up in 2-3 days if no improvement with treatment or no complete resolution by 7 days. Earlier f-u if pt develops rash, allergic reaction to the medication, or loss of hearing.  I have also discussed reasons to return immediately to the ER.  Patient expresses understanding and agrees with plan.  I personally performed the services described in this documentation, which was scribed in my presence. The recorded information has been reviewed and is accurate.    Dierdre Forth, PA-C 02/07/13 1933

## 2013-02-10 NOTE — ED Provider Notes (Signed)
Medical screening examination/treatment/procedure(s) were performed by non-physician practitioner and as supervising physician I was immediately available for consultation/collaboration.  Candyce Churn, MD 02/10/13 [phone]

## 2014-04-08 ENCOUNTER — Encounter (HOSPITAL_COMMUNITY): Payer: Self-pay | Admitting: Emergency Medicine

## 2015-07-09 ENCOUNTER — Encounter (HOSPITAL_COMMUNITY): Payer: Self-pay | Admitting: *Deleted

## 2015-07-09 ENCOUNTER — Emergency Department (HOSPITAL_COMMUNITY): Payer: Self-pay

## 2015-07-09 ENCOUNTER — Emergency Department (HOSPITAL_COMMUNITY)
Admission: EM | Admit: 2015-07-09 | Discharge: 2015-07-09 | Disposition: A | Discharge status: Home / Self Care | Payer: Self-pay | Attending: Emergency Medicine | Admitting: Emergency Medicine

## 2015-07-09 DIAGNOSIS — M62830 Muscle spasm of back: Secondary | ICD-10-CM | POA: Insufficient documentation

## 2015-07-09 DIAGNOSIS — Z3202 Encounter for pregnancy test, result negative: Secondary | ICD-10-CM | POA: Insufficient documentation

## 2015-07-09 DIAGNOSIS — M5432 Sciatica, left side: Secondary | ICD-10-CM | POA: Insufficient documentation

## 2015-07-09 DIAGNOSIS — Z8659 Personal history of other mental and behavioral disorders: Secondary | ICD-10-CM | POA: Insufficient documentation

## 2015-07-09 DIAGNOSIS — F17210 Nicotine dependence, cigarettes, uncomplicated: Secondary | ICD-10-CM | POA: Insufficient documentation

## 2015-07-09 LAB — URINALYSIS, ROUTINE W REFLEX MICROSCOPIC
Bilirubin Urine: NEGATIVE
Glucose, UA: NEGATIVE mg/dL
Hgb urine dipstick: NEGATIVE
KETONES UR: NEGATIVE mg/dL
NITRITE: NEGATIVE
PROTEIN: NEGATIVE mg/dL
Specific Gravity, Urine: 1.022 (ref 1.005–1.030)
pH: 6 (ref 5.0–8.0)

## 2015-07-09 LAB — POC URINE PREG, ED: PREG TEST UR: NEGATIVE

## 2015-07-09 LAB — URINE MICROSCOPIC-ADD ON

## 2015-07-09 MED ORDER — PREDNISONE 10 MG PO TABS: 20.0000 mg | ORAL_TABLET | Freq: Two times a day (BID) | ORAL | Status: DC

## 2015-07-09 MED ORDER — CYCLOBENZAPRINE HCL 10 MG PO TABS: 10.0000 mg | ORAL_TABLET | Freq: Two times a day (BID) | ORAL | Status: DC | PRN

## 2015-07-09 MED ORDER — HYDROCODONE-ACETAMINOPHEN 5-325 MG PO TABS: 1.0000 | ORAL_TABLET | Freq: Four times a day (QID) | ORAL | Status: DC | PRN

## 2015-07-09 NOTE — Discharge Instructions (Signed)
Do not drive while taking the narcotic or muscle relaxant because they will make you sleepy.

## 2015-07-09 NOTE — ED Notes (Signed)
The pt is c/o lt flank pain for one week  No known injury.  No bloody urine.  No previous history.  lmp dec 23

## 2015-07-09 NOTE — ED Notes (Signed)
Patient transported to X-ray 

## 2015-07-09 NOTE — ED Notes (Signed)
Pt returned from xray

## 2015-07-09 NOTE — ED Provider Notes (Signed)
CSN: [id number]     Arrival date & time 07/09/15  1748 History  By signing my name below, I, Elon Spanner, attest that this documentation has been prepared under the direction and in the presence of Kerrie Buffalo, NP. Electronically Signed: Elon Spanner ED Scribe. 07/09/2015. 6:47 PM.    Chief Complaint  Patient presents with  . Flank Pain    Patient is a 30 y.o. female presenting with back pain. The history is provided by the patient. No language interpreter was used.  Back Pain Pain location: left lower. Radiates to:  Does not radiate Pain severity:  Moderate Pain is:  Same all the time Onset quality:  Gradual Duration:  1 week Timing:  Constant Progression:  Worsening Chronicity:  New Context: not falling, not jumping from heights, not lifting heavy objects, not physical stress, not recent injury and not twisting   Relieved by:  NSAIDs Worsened by:  Bending Ineffective treatments:  None tried Associated symptoms: no dysuria    HPI Comments: Mindy Bradford is a 30 y.o. female who presents to the Emergency Department complaining of constant, minimally worsened, 8/10 left lower back pain onset 1 week ago without injury, straining, or new motion; minimally relieved by Tylenol/ibuprofen.  The pain is worse with bending over but there are no alleviating factors.  She denies hx of similar episodes.  She denies urinary frequency, dysuria, other complaints.    Past Medical History  Diagnosis Date  . 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 Leep  5 years ago    normal results  . Cholecystectomy  2008  . Wisdom tooth extraction      age 66   Family History  Problem Relation Age of Onset  . Asthma Mother   . Heart disease    . Cancer    . Diabetes    . Hyperlipidemia    . Hypertension    . Asthma Sister   . ADD / ADHD Sister   . ADD / ADHD Brother    Social History  Substance Use Topics  .  Smoking status: Current Every Day Smoker -- 0.25 packs/day for 8 years    Types: Cigarettes  . Smokeless tobacco: Never Used  . Alcohol Use: No   OB History    Gravida Para Term Preterm AB TAB SAB Ectopic Multiple Living   2 2 2       2      Review of Systems  Genitourinary: Negative for dysuria.  Musculoskeletal: Positive for back pain.    A complete 10 system review of systems was obtained and all systems are negative except as noted in the HPI and PMH.   Allergies  Sulfa antibiotics  Home Medications   Prior to Admission medications   Medication Sig Start Date End Date Taking? Authorizing Provider  ciprofloxacin-hydrocortisone (CIPRO HC) otic suspension Place 3 drops into the right ear 2 (two) times daily. For 10 days 02/07/13   Dahlia Client Muthersbaugh, PA-C  cyclobenzaprine (FLEXERIL) 10 MG tablet Take 1 tablet (10 mg total) by mouth 2 (two) times daily as needed for muscle spasms. 07/09/15   Hope Orlene Och, NP  HYDROcodone-acetaminophen (NORCO) 5-325 MG tablet Take 1 tablet by mouth every 6 (six) hours as needed. 07/09/15   Hope Orlene Och, NP  predniSONE (DELTASONE) 10 MG tablet Take 2 tablets (20 mg total) by mouth 2 (two) times daily with a meal. 07/09/15  Hope Orlene Och, NP   BP 122/81 mmHg  Pulse 105  Temp(Src) 98.5 F (36.9 C) (Oral)  Resp 18  Ht  (1.702 m)  Wt 117.935 kg  BMI 40.71 kg/m2  SpO2 97%  LMP 05/30/2015 Physical Exam  Constitutional: She is oriented to person, place, and time. She appears well-developed and well-nourished. No distress.  HENT:  Head: Normocephalic and atraumatic.  Nose: Nose normal.  Eyes: Conjunctivae and EOM are normal.  Neck: Normal range of motion. Neck supple. No tracheal deviation present.  Cardiovascular: Normal rate and regular rhythm.   Pulmonary/Chest: Effort normal. No respiratory distress. She has no wheezes. She has no rales.  Abdominal: Soft. Bowel sounds are normal. There is no tenderness.  Musculoskeletal: Normal range of motion.        Lumbar back: She exhibits tenderness, pain and spasm. She exhibits normal pulse.  Neurological: She is alert and oriented to person, place, and time. She has normal strength. No cranial nerve deficit or sensory deficit. Gait normal.  Reflex Scores:      Bicep reflexes are 2+ on the right side and 2+ on the left side.      Brachioradialis reflexes are 2+ on the right side and 2+ on the left side.      Patellar reflexes are 2+ on the right side and 2+ on the left side. Skin: Skin is warm and dry.  Psychiatric: She has a normal mood and affect. Her behavior is normal.  Nursing note and vitals reviewed.   ED Course  Procedures (including critical care time)  DIAGNOSTIC STUDIES: Oxygen Saturation is 100% on RA, normal by my interpretation.    COORDINATION OF CARE:  7:02 PM Will order lumbar spine imaging and review labs.  Patient acknowledges and agrees with plan.    Labs Review Results for orders placed or performed during the hospital encounter of 07/09/15 (from the past 24 hour(s))  POC Urine Pregnancy, ED (do NOT order at [facility])     Status: None   Collection Time: 07/09/15  6:17 PM  Result Value Ref Range   Preg Test, Ur NEGATIVE NEGATIVE  Urinalysis, Routine w reflex microscopic (not at [facility])     Status: Abnormal   Collection Time: 07/09/15  6:29 PM  Result Value Ref Range   Color, Urine YELLOW YELLOW   APPearance CLOUDY (A) CLEAR   Specific Gravity, Urine 1.022 1.005 - 1.030   pH 6.0 5.0 - 8.0   Glucose, UA NEGATIVE NEGATIVE mg/dL   Hgb urine dipstick NEGATIVE NEGATIVE   Bilirubin Urine NEGATIVE NEGATIVE   Ketones, ur NEGATIVE NEGATIVE mg/dL   Protein, ur NEGATIVE NEGATIVE mg/dL   Nitrite NEGATIVE NEGATIVE   Leukocytes, UA SMALL (A) NEGATIVE  Urine microscopic-add on     Status: Abnormal   Collection Time: 07/09/15  6:29 PM  Result Value Ref Range   Squamous Epithelial / LPF 6-30 (A) NONE SEEN   WBC, UA 0-5 0 - 5 WBC/hpf   RBC / HPF 0-5 0 - 5 RBC/hpf   Bacteria,  UA FEW (A) NONE SEEN     Imaging Review Dg Lumbar Spine Complete  07/09/2015  CLINICAL DATA:  30 year old female with left lower back pain for 1 week. EXAM: LUMBAR SPINE - COMPLETE 4+ VIEW COMPARISON:  No priors. FINDINGS: There is no evidence of lumbar spine fracture. Alignment is normal. Intervertebral disc spaces are maintained. IMPRESSION: Negative. Electronically Signed   By: Trudie Reed M.D.   On: 07/09/2015 19:32  I have personally reviewed and evaluated the lab results as part of my medical decision-making.   MDM  30 y.o. female with left lower back pain that radiates to the left buttock stable for d/c without focal neuro deficits and no UTI. Will treat for sciatica and she will follow up with ortho if symptoms persist. Discussed with the patient and all questioned fully answered. She will return if any problems arise.  Final diagnoses:  Sciatica, left   I personally performed the services described in this documentation, which was scribed in my presence. The recorded information has been reviewed and is accurate.    [address] [place], [place] 07/10/15 1046  Nelva Nay, MD 07/13/15 [phone]

## 2015-08-06 ENCOUNTER — Encounter (HOSPITAL_COMMUNITY): Payer: Self-pay | Admitting: Family Medicine

## 2015-08-06 ENCOUNTER — Emergency Department (HOSPITAL_COMMUNITY)
Admission: EM | Admit: 2015-08-06 | Discharge: 2015-08-06 | Disposition: A | Discharge status: Home / Self Care | Payer: Self-pay | Attending: Emergency Medicine | Admitting: Emergency Medicine

## 2015-08-06 ENCOUNTER — Emergency Department (HOSPITAL_COMMUNITY): Payer: Self-pay

## 2015-08-06 DIAGNOSIS — F17210 Nicotine dependence, cigarettes, uncomplicated: Secondary | ICD-10-CM | POA: Insufficient documentation

## 2015-08-06 DIAGNOSIS — R0789 Other chest pain: Secondary | ICD-10-CM | POA: Insufficient documentation

## 2015-08-06 DIAGNOSIS — Z8659 Personal history of other mental and behavioral disorders: Secondary | ICD-10-CM | POA: Insufficient documentation

## 2015-08-06 DIAGNOSIS — Z7952 Long term (current) use of systemic steroids: Secondary | ICD-10-CM | POA: Insufficient documentation

## 2015-08-06 LAB — CBC
HEMATOCRIT: 46 % (ref 36.0–46.0)
HEMOGLOBIN: 15.4 g/dL — AB (ref 12.0–15.0)
MCH: 28.2 pg (ref 26.0–34.0)
MCHC: 33.5 g/dL (ref 30.0–36.0)
MCV: 84.1 fL (ref 78.0–100.0)
Platelets: 257 10*3/uL (ref 150–400)
RBC: 5.47 MIL/uL — AB (ref 3.87–5.11)
RDW: 12.8 % (ref 11.5–15.5)
WBC: 10.3 10*3/uL (ref 4.0–10.5)

## 2015-08-06 LAB — BASIC METABOLIC PANEL
ANION GAP: 12 (ref 5–15)
BUN: 6 mg/dL (ref 6–20)
CALCIUM: 9.7 mg/dL (ref 8.9–10.3)
CHLORIDE: 105 mmol/L (ref 101–111)
CO2: 23 mmol/L (ref 22–32)
Creatinine, Ser: 0.64 mg/dL (ref 0.44–1.00)
Glucose, Bld: 127 mg/dL — ABNORMAL HIGH (ref 65–99)
Potassium: 4 mmol/L (ref 3.5–5.1)
SODIUM: 140 mmol/L (ref 135–145)

## 2015-08-06 LAB — I-STAT TROPONIN, ED: TROPONIN I, POC: 0 ng/mL (ref 0.00–0.08)

## 2015-08-06 MED ORDER — IBUPROFEN 800 MG PO TABS
800.0000 mg | ORAL_TABLET | Freq: Once | ORAL | Status: AC
  Administered 2015-08-06: 800 mg via ORAL
  Filled 2015-08-06: qty 1

## 2015-08-06 MED ORDER — CYCLOBENZAPRINE HCL 10 MG PO TABS
5.0000 mg | ORAL_TABLET | Freq: Once | ORAL | Status: AC
  Administered 2015-08-06: 5 mg via ORAL
  Filled 2015-08-06: qty 1

## 2015-08-06 MED ORDER — IBUPROFEN 800 MG PO TABS: 800.0000 mg | ORAL_TABLET | Freq: Three times a day (TID) | ORAL | Status: DC

## 2015-08-06 MED ORDER — CYCLOBENZAPRINE HCL 10 MG PO TABS: 10.0000 mg | ORAL_TABLET | Freq: Two times a day (BID) | ORAL | Status: DC | PRN

## 2015-08-06 NOTE — ED Notes (Signed)
Pt here for chest pain that started last night. sts she was getting up to give son a bath. sts it hurts when she breathes. Denies cough, fever.

## 2015-08-06 NOTE — Discharge Instructions (Signed)
Take motrin for pain.   Take flexeril for muscle spasms.   Avoid heavy lifting.   See your doctor   Return to ER if you have severe chest pain, shortness of breath.

## 2015-08-06 NOTE — ED Provider Notes (Signed)
CSN: [id number]     Arrival date & time 08/06/15  4098 History   First MD Initiated Contact with Patient 08/06/15 [phone]     Chief Complaint  Patient presents with  . Chest Pain     (Consider location/radiation/quality/duration/timing/severity/associated sxs/prior Treatment) The history is provided by the patient.  Mindy Bradford is a 30 y.o. female hx of ADD, here with chest pain. Patient states that she has a 30 year old and a 86-year-old at home. She was tried to bathe the ?4 yesterday and noticed left-sided chest pain that is worse with movement and worse with taking a deep breath. Denies any shortness of breath that baseline. Denies any cough or fevers. She states that she does pick up her 30 year old. She denies any recent trauma or fall. Denies recent travels or leg swelling or hx of blood clots.    Past Medical History  Diagnosis Date  . 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 Leep  5 years ago    normal results  . Cholecystectomy  2008  . Wisdom tooth extraction      age 82   Family History  Problem Relation Age of Onset  . Asthma Mother   . Heart disease    . Cancer    . Diabetes    . Hyperlipidemia    . Hypertension    . Asthma Sister   . ADD / ADHD Sister   . ADD / ADHD Brother    Social History  Substance Use Topics  . Smoking status: Current Every Day Smoker -- 0.25 packs/day for 8 years    Types: Cigarettes  . Smokeless tobacco: Never Used  . Alcohol Use: No   OB History    Gravida Para Term Preterm AB TAB SAB Ectopic Multiple Living   Review of Systems  Cardiovascular: Positive for chest pain.  All other systems reviewed and are negative.     Allergies  Sulfa antibiotics  Home Medications   Prior to Admission medications   Medication Sig Start Date End Date Taking? Authorizing Provider  ciprofloxacin-hydrocortisone (CIPRO HC) otic  suspension Place 3 drops into the right ear 2 (two) times daily. For 10 days 02/07/13   Mindy Client Muthersbaugh, PA-C  cyclobenzaprine (FLEXERIL) 10 MG tablet Take 1 tablet (10 mg total) by mouth 2 (two) times daily as needed for muscle spasms. 07/09/15   Mindy Orlene Och, NP  HYDROcodone-acetaminophen (NORCO) 5-325 MG tablet Take 1 tablet by mouth every 6 (six) hours as needed. 07/09/15   Mindy Orlene Och, NP  predniSONE (DELTASONE) 10 MG tablet Take 2 tablets (20 mg total) by mouth 2 (two) times daily with a meal. 07/09/15   Mindy Orlene Och, NP   BP 108/71 mmHg  Pulse 83  Temp(Src) 97.8 F (36.6 C)  Resp 25  SpO2 95%  LMP 07/15/2015 Physical Exam  Constitutional: She is oriented to person, place, and time. She appears well-developed and well-nourished.  HENT:  Head: Normocephalic.  Mouth/Throat: Oropharynx is clear and moist.  Eyes: Conjunctivae are normal. Pupils are equal, round, and reactive to light.  Neck: Normal range of motion. Neck supple.  Cardiovascular: Normal rate, regular rhythm and normal heart sounds.   Pulmonary/Chest: Effort normal and breath sounds normal. No respiratory distress. She has no wheezes. She has no rales.  Reproducible L sided chest  tenderness   Abdominal: Soft. Bowel sounds are normal. She exhibits no distension. There is no tenderness. There is no rebound and no guarding.  Musculoskeletal: Normal range of motion. She exhibits no edema or tenderness.  Neurological: She is alert and oriented to person, place, and time. No cranial nerve deficit. Coordination normal.  Skin: Skin is warm and dry.  Psychiatric: She has a normal mood and affect. Her behavior is normal. Judgment and thought content normal.  Nursing note and vitals reviewed.   ED Course  Procedures (including critical care time) Labs Review Labs Reviewed  BASIC METABOLIC PANEL - Abnormal; Notable for the following:    Glucose, Bld 127 (*)    All other components within normal limits  CBC - Abnormal; Notable  for the following:    RBC 5.47 (*)    Hemoglobin 15.4 (*)    All other components within normal limits  I-STAT TROPOININ, ED    Imaging Review Dg Chest 2 View  08/06/2015  CLINICAL DATA:  Chest pain for 2 days EXAM: CHEST  2 VIEW COMPARISON:  None. FINDINGS: Mild peribronchial thickening. Heart and mediastinal contours are within normal limits. No focal opacities or effusions. No acute bony abnormality. IMPRESSION: Mild bronchitic changes. Electronically Signed   By: Mindy Bradford M.D.   On: 08/06/2015 09:11   I have personally reviewed and evaluated these images and lab results as part of my medical decision-making.   EKG Interpretation   Date/Time:  Wednesday August 06 2015 08:30:53 EST Ventricular Rate:  99 PR Interval:  112 QRS Duration: 86 QT Interval:  370 QTC Calculation: 474 R Axis:   83 Text Interpretation:  Normal sinus rhythm Normal ECG No previous ECGs  available Confirmed by Mindy Metayer  MD, Mindy Bradford ([id number]) on 08/06/2015 9:18:26 AM      MDM   Final diagnoses:  None   Mindy Bradford is a 30 y.o. female here with L sided chest pain. Pain is reproducible. No hx of CAD and pain for 12 hrs so trop x 1 sufficient. I doubt PE or dissection. Will give motrin, flexeril and reassess.   11:21 AM Labs unremarkable. CXR nl. Felt better with flexeril, motrin. Will dc home.     Mindy Canal, MD 08/06/15 [phone]

## 2015-09-03 ENCOUNTER — Emergency Department (HOSPITAL_COMMUNITY)
Admission: EM | Admit: 2015-09-03 | Discharge: 2015-09-03 | Disposition: A | Discharge status: Home / Self Care | Payer: Self-pay | Attending: Emergency Medicine | Admitting: Emergency Medicine

## 2015-09-03 ENCOUNTER — Encounter (HOSPITAL_COMMUNITY): Payer: Self-pay | Admitting: Emergency Medicine

## 2015-09-03 DIAGNOSIS — Z8659 Personal history of other mental and behavioral disorders: Secondary | ICD-10-CM | POA: Insufficient documentation

## 2015-09-03 DIAGNOSIS — H9209 Otalgia, unspecified ear: Secondary | ICD-10-CM | POA: Insufficient documentation

## 2015-09-03 DIAGNOSIS — Z791 Long term (current) use of non-steroidal anti-inflammatories (NSAID): Secondary | ICD-10-CM | POA: Insufficient documentation

## 2015-09-03 DIAGNOSIS — J209 Acute bronchitis, unspecified: Secondary | ICD-10-CM | POA: Insufficient documentation

## 2015-09-03 DIAGNOSIS — F17210 Nicotine dependence, cigarettes, uncomplicated: Secondary | ICD-10-CM | POA: Insufficient documentation

## 2015-09-03 DIAGNOSIS — Z7952 Long term (current) use of systemic steroids: Secondary | ICD-10-CM | POA: Insufficient documentation

## 2015-09-03 DIAGNOSIS — J069 Acute upper respiratory infection, unspecified: Secondary | ICD-10-CM | POA: Insufficient documentation

## 2015-09-03 DIAGNOSIS — J40 Bronchitis, not specified as acute or chronic: Secondary | ICD-10-CM

## 2015-09-03 MED ORDER — DOXYCYCLINE HYCLATE 100 MG PO CAPS: 100.0000 mg | ORAL_CAPSULE | Freq: Two times a day (BID) | ORAL | Status: DC

## 2015-09-03 MED ORDER — HYDROCODONE-HOMATROPINE 5-1.5 MG/5ML PO SYRP: 5.0000 mL | ORAL_SOLUTION | Freq: Four times a day (QID) | ORAL | Status: DC | PRN

## 2015-09-03 NOTE — Discharge Instructions (Signed)
Cool Mist Vaporizers °Vaporizers may help relieve the symptoms of a cough and cold. They add moisture to the air, which helps mucus to become thinner and less sticky. This makes it easier to breathe and cough up secretions. Cool mist vaporizers do not cause serious burns like hot mist vaporizers, which may also be called steamers or humidifiers. Vaporizers have not been proven to help with colds. You should not use a vaporizer if you are allergic to mold. °HOME CARE INSTRUCTIONS °· Follow the package instructions for the vaporizer. °· Do not use anything other than distilled water in the vaporizer. °· Do not run the vaporizer all of the time. This can cause mold or bacteria to grow in the vaporizer. °· Clean the vaporizer after each time it is used. °· Clean and dry the vaporizer well before storing it. °· Stop using the vaporizer if worsening respiratory symptoms develop. °  °This information is not intended to replace advice given to you by your health care provider. Make sure you discuss any questions you have with your health care provider. °  °Document Released: 02/19/2004 Document Revised: 05/29/2013 Document Reviewed: 10/11/2012 °Elsevier Interactive Patient Education ©2016 [employer] ° °Upper Respiratory Infection, Adult °Most upper respiratory infections (URIs) are caused by a virus. A URI affects the nose, throat, and upper air passages. The most common type of URI is often called "the common cold." °HOME CARE  °· Take medicines only as told by your doctor. °· Gargle warm saltwater or take cough drops to comfort your throat as told by your doctor. °· Use a warm mist humidifier or inhale steam from a shower to increase air moisture. This may make it easier to breathe. °· Drink enough fluid to keep your pee (urine) clear or pale yellow. °· Eat soups and other clear broths. °· Have a healthy diet. °· Rest as needed. °· Go back to work when your fever is gone or your doctor says it is okay. °¨ You may need  to stay home longer to avoid giving your URI to others. °¨ You can also wear a face mask and wash your hands often to prevent spread of the virus. °· Use your inhaler more if you have asthma. °· Do not use any tobacco products, including cigarettes, chewing tobacco, or electronic cigarettes. If you need help quitting, ask your doctor. °GET HELP IF: °· You are getting worse, not better. °· Your symptoms are not helped by medicine. °· You have chills. °· You are getting more short of breath. °· You have brown or red mucus. °· You have yellow or brown discharge from your nose. °· You have pain in your face, especially when you bend forward. °· You have a fever. °· You have puffy (swollen) neck glands. °· You have pain while swallowing. °· You have white areas in the back of your throat. °GET HELP RIGHT AWAY IF:  °· You have very bad or constant: °¨ Headache. °¨ Ear pain. °¨ Pain in your forehead, behind your eyes, and over your cheekbones (sinus pain). °¨ Chest pain. °· You have long-lasting (chronic) lung disease and any of the following: °¨ Wheezing. °¨ Long-lasting cough. °¨ Coughing up blood. °¨ A change in your usual mucus. °· You have a stiff neck. °· You have changes in your: °¨ Vision. °¨ Hearing. °¨ Thinking. °¨ Mood. °MAKE SURE YOU:  °· Understand these instructions. °· Will watch your condition. °· Will get help right away if you are not doing well   or get worse. °  °This information is not intended to replace advice given to you by your health care provider. Make sure you discuss any questions you have with your health care provider. °  °Document Released: 11/10/2007 Document Revised: 10/08/2014 Document Reviewed: 08/29/2013 °Elsevier Interactive Patient Education ©2016 Elsevier Inc. ° °

## 2015-09-03 NOTE — ED Provider Notes (Signed)
CSN: [id number]     Arrival date & time 09/03/15  0911 History   First MD Initiated Contact with Patient 09/03/15 0945     Chief Complaint  Patient presents with  . Cough  . Sore Throat      HPI Patient presents with 24-hour history of congestion and runny nose body aches with no fever.  Ear pain and headaches and chest today.  No vomiting or diarrhea.  Patient is a smoker.  Denies abdominal pain. Past Medical History  Diagnosis Date  . 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 Leep  5 years ago    normal results  . Cholecystectomy  2008  . Wisdom tooth extraction      age 30   Family History  Problem Relation Age of Onset  . Asthma Mother   . Heart disease    . Cancer    . Diabetes    . Hyperlipidemia    . Hypertension    . Asthma Sister   . ADD / ADHD Sister   . ADD / ADHD Brother    Social History  Substance Use Topics  . Smoking status: Current Every Day Smoker -- 0.25 packs/day for 8 years    Types: Cigarettes  . Smokeless tobacco: Never Used  . Alcohol Use: No   OB History    Gravida Para Term Preterm AB TAB SAB Ectopic Multiple Living   2 2 2       2      Review of Systems  All other systems reviewed and are negative  Allergies  Sulfa antibiotics  Home Medications   Prior to Admission medications   Medication Sig Start Date End Date Taking? Authorizing Provider  ciprofloxacin-hydrocortisone (CIPRO HC) otic suspension Place 3 drops into the right ear 2 (two) times daily. For 10 days 02/07/13   Dahlia ClientHannah Muthersbaugh, PA-C  cyclobenzaprine (FLEXERIL) 10 MG tablet Take 1 tablet (10 mg total) by mouth 2 (two) times daily as needed for muscle spasms. 08/06/15   Richardean Canalavid H Yao, MD  doxycycline (VIBRAMYCIN) 100 MG capsule Take 1 capsule (100 mg total) by mouth 2 (two) times daily. 09/03/15   Nelva Nayobert Remmington Teters, MD  HYDROcodone-acetaminophen (NORCO) 5-325 MG tablet Take 1 tablet by mouth  every 6 (six) hours as needed. 07/09/15   Hope Orlene OchM Neese, NP  HYDROcodone-homatropine (HYCODAN) 5-1.5 MG/5ML syrup Take 5 mLs by mouth every 6 (six) hours as needed for cough. 09/03/15   Nelva Nayobert Alonni Heimsoth, MD  ibuprofen (ADVIL,MOTRIN) 800 MG tablet Take 1 tablet (800 mg total) by mouth 3 (three) times daily. 08/06/15   Richardean Canalavid H Yao, MD  predniSONE (DELTASONE) 10 MG tablet Take 2 tablets (20 mg total) by mouth 2 (two) times daily with a meal. 07/09/15   Hope Orlene OchM Neese, NP   BP 123/74 mmHg  Pulse 93  Temp(Src) 98.1 F (36.7 C) (Oral)  Resp 20  SpO2 90%  LMP 08/08/2015 (Exact Date) Physical Exam Physical Exam  Nursing note and vitals reviewed. Constitutional: She is oriented to person, place, and time. She appears well-developed and well-nourished. No distress.  HENT:  Head: Normocephalic and atraumatic.  Eyes: Pupils are equal, round, and reactive to light.  Neck: Normal range of motion.  Cardiovascular: Normal rate and intact distal pulses.   Pulmonary/Chest: No respiratory distress.  Abdominal: Normal appearance. She exhibits no distension.  Musculoskeletal: Normal range of motion.  Neurological: She is alert and  oriented to person, place, and time. No cranial nerve deficit.  Skin: Skin is warm and dry. No rash noted.  Psychiatric: She has a normal mood and affect. Her behavior is normal.   ED Course  Procedures (including critical care time) Labs Review Labs Reviewed - No data to display  Imaging Review No results found. I have personally reviewed and evaluated these images and lab results as part of my medical decision-making.   EKG Interpretation None      MDM   Final diagnoses:  Upper respiratory infection  Bronchitis        Nelva Nay, MD 09/03/15 [phone]

## 2015-09-03 NOTE — ED Notes (Signed)
Pt repots chest congestion, sore throat, ear pain and a headache since yesterday. Pt alert x4. NAD at this time.

## 2015-10-20 ENCOUNTER — Encounter (HOSPITAL_COMMUNITY): Payer: Self-pay | Admitting: Emergency Medicine

## 2015-10-20 ENCOUNTER — Emergency Department (HOSPITAL_COMMUNITY)
Admission: EM | Admit: 2015-10-20 | Discharge: 2015-10-20 | Disposition: A | Discharge status: Home / Self Care | Payer: No Typology Code available for payment source | Attending: Emergency Medicine | Admitting: Emergency Medicine

## 2015-10-20 DIAGNOSIS — S3992XA Unspecified injury of lower back, initial encounter: Secondary | ICD-10-CM | POA: Diagnosis not present

## 2015-10-20 DIAGNOSIS — Z791 Long term (current) use of non-steroidal anti-inflammatories (NSAID): Secondary | ICD-10-CM | POA: Insufficient documentation

## 2015-10-20 DIAGNOSIS — Z8659 Personal history of other mental and behavioral disorders: Secondary | ICD-10-CM | POA: Insufficient documentation

## 2015-10-20 DIAGNOSIS — F17210 Nicotine dependence, cigarettes, uncomplicated: Secondary | ICD-10-CM | POA: Insufficient documentation

## 2015-10-20 DIAGNOSIS — Z7952 Long term (current) use of systemic steroids: Secondary | ICD-10-CM | POA: Insufficient documentation

## 2015-10-20 DIAGNOSIS — Y92410 Unspecified street and highway as the place of occurrence of the external cause: Secondary | ICD-10-CM | POA: Diagnosis not present

## 2015-10-20 DIAGNOSIS — Z792 Long term (current) use of antibiotics: Secondary | ICD-10-CM | POA: Diagnosis not present

## 2015-10-20 DIAGNOSIS — Y9389 Activity, other specified: Secondary | ICD-10-CM | POA: Insufficient documentation

## 2015-10-20 DIAGNOSIS — M549 Dorsalgia, unspecified: Secondary | ICD-10-CM

## 2015-10-20 DIAGNOSIS — Y998 Other external cause status: Secondary | ICD-10-CM | POA: Insufficient documentation

## 2015-10-20 MED ORDER — DIAZEPAM 5 MG PO TABS
5.0000 mg | ORAL_TABLET | Freq: Once | ORAL | Status: AC
  Administered 2015-10-20: 5 mg via ORAL
  Filled 2015-10-20: qty 1

## 2015-10-20 MED ORDER — NAPROXEN 250 MG PO TABS
500.0000 mg | ORAL_TABLET | Freq: Once | ORAL | Status: AC
  Administered 2015-10-20: 500 mg via ORAL
  Filled 2015-10-20: qty 2

## 2015-10-20 MED ORDER — DIAZEPAM 5 MG PO TABS: 5.0000 mg | ORAL_TABLET | Freq: Two times a day (BID) | ORAL | Status: DC

## 2015-10-20 MED ORDER — NAPROXEN 500 MG PO TABS: 500.0000 mg | ORAL_TABLET | Freq: Two times a day (BID) | ORAL | Status: DC

## 2015-10-20 NOTE — Discharge Instructions (Signed)
Mindy Bradford,  Nice meeting you! Please follow-up with your primary care provider. Return to the emergency department if you develop fevers, chills, loss of bladder/bowel control, increased pain, new/worsening symptoms. Feel better soon!  S. [name], PA-C   Back Pain, Adult Back pain is very common in adults.The cause of back pain is rarely dangerous and the pain often gets better over time.The cause of your back pain may not be known. Some common causes of back pain include:  Strain of the muscles or ligaments supporting the spine.  Wear and tear (degeneration) of the spinal disks.  Arthritis.  Direct injury to the back. For many people, back pain may return. Since back pain is rarely dangerous, most people can learn to manage this condition on their own. HOME CARE INSTRUCTIONS Watch your back pain for any changes. The following actions may help to lessen any discomfort you are feeling:  Remain active. It is stressful on your back to sit or stand in one place for long periods of time. Do not sit, drive, or stand in one place for more than 30 minutes at a time. Take short walks on even surfaces as soon as you are able.Try to increase the length of time you walk each day.  Exercise regularly as directed by your health care provider. Exercise helps your back heal faster. It also helps avoid future injury by keeping your muscles strong and flexible.  Do not stay in bed.Resting more than 1-2 days can delay your recovery.  Pay attention to your body when you bend and lift. The most comfortable positions are those that put less stress on your recovering back. Always use proper lifting techniques, including:  Bending your knees.  Keeping the load close to your body.  Avoiding twisting.  Find a comfortable position to sleep. Use a firm mattress and lie on your side with your knees slightly bent. If you lie on your back, put a pillow under your knees.  Avoid feeling  anxious or stressed.Stress increases muscle tension and can worsen back pain.It is important to recognize when you are anxious or stressed and learn ways to manage it, such as with exercise.  Take medicines only as directed by your health care provider. Over-the-counter medicines to reduce pain and inflammation are often the most helpful.Your health care provider may prescribe muscle relaxant drugs.These medicines help dull your pain so you can more quickly return to your normal activities and healthy exercise.  Apply ice to the injured area:  Put ice in a plastic bag.  Place a towel between your skin and the bag.  Leave the ice on for 20 minutes, 2-3 times a day for the first 2-3 days. After that, ice and heat may be alternated to reduce pain and spasms.  Maintain a healthy weight. Excess weight puts extra stress on your back and makes it difficult to maintain good posture. SEEK MEDICAL CARE IF:  You have pain that is not relieved with rest or medicine.  You have increasing pain going down into the legs or buttocks.  You have pain that does not improve in one week.  You have night pain.  You lose weight.  You have a fever or chills. SEEK IMMEDIATE MEDICAL CARE IF:   You develop new bowel or bladder control problems.  You have unusual weakness or numbness in your arms or legs.  You develop nausea or vomiting.  You develop abdominal pain.  You feel faint.   This information is  not intended to replace advice given to you by your health care provider. Make sure you discuss any questions you have with your health care provider.   Document Released: 05/24/2005 Document Revised: 06/14/2014 Document Reviewed: 09/25/2013 Elsevier Interactive Patient Education 2016 Elsevier Inc.    Back Pain, Adult Back pain is very common in adults.The cause of back pain is rarely dangerous and the pain often gets better over time.The cause of your back pain may not be known. Some common  causes of back pain include:  Strain of the muscles or ligaments supporting the spine.  Wear and tear (degeneration) of the spinal disks.  Arthritis.  Direct injury to the back. For many people, back pain may return. Since back pain is rarely dangerous, most people can learn to manage this condition on their own. HOME CARE INSTRUCTIONS Watch your back pain for any changes. The following actions may help to lessen any discomfort you are feeling:  Remain active. It is stressful on your back to sit or stand in one place for long periods of time. Do not sit, drive, or stand in one place for more than 30 minutes at a time. Take short walks on even surfaces as soon as you are able.Try to increase the length of time you walk each day.  Exercise regularly as directed by your health care provider. Exercise helps your back heal faster. It also helps avoid future injury by keeping your muscles strong and flexible.  Do not stay in bed.Resting more than 1-2 days can delay your recovery.  Pay attention to your body when you bend and lift. The most comfortable positions are those that put less stress on your recovering back. Always use proper lifting techniques, including:  Bending your knees.  Keeping the load close to your body.  Avoiding twisting.  Find a comfortable position to sleep. Use a firm mattress and lie on your side with your knees slightly bent. If you lie on your back, put a pillow under your knees.  Avoid feeling anxious or stressed.Stress increases muscle tension and can worsen back pain.It is important to recognize when you are anxious or stressed and learn ways to manage it, such as with exercise.  Take medicines only as directed by your health care provider. Over-the-counter medicines to reduce pain and inflammation are often the most helpful.Your health care provider may prescribe muscle relaxant drugs.These medicines help dull your pain so you can more quickly return to  your normal activities and healthy exercise.  Apply ice to the injured area:  Put ice in a plastic bag.  Place a towel between your skin and the bag.  Leave the ice on for 20 minutes, 2-3 times a day for the first 2-3 days. After that, ice and heat may be alternated to reduce pain and spasms.  Maintain a healthy weight. Excess weight puts extra stress on your back and makes it difficult to maintain good posture. SEEK MEDICAL CARE IF:  You have pain that is not relieved with rest or medicine.  You have increasing pain going down into the legs or buttocks.  You have pain that does not improve in one week.  You have night pain.  You lose weight.  You have a fever or chills. SEEK IMMEDIATE MEDICAL CARE IF:   You develop new bowel or bladder control problems.  You have unusual weakness or numbness in your arms or legs.  You develop nausea or vomiting.  You develop abdominal pain.  You feel faint.  This information is not intended to replace advice given to you by your health care provider. Make sure you discuss any questions you have with your health care provider.   Document Released: 05/24/2005 Document Revised: 06/14/2014 Document Reviewed: 09/25/2013 Elsevier Interactive Patient Education [employer].
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
## 2015-10-27 ENCOUNTER — Encounter (HOSPITAL_COMMUNITY): Payer: Self-pay | Admitting: *Deleted

## 2015-10-27 ENCOUNTER — Emergency Department (HOSPITAL_COMMUNITY)
Admission: EM | Admit: 2015-10-27 | Discharge: 2015-10-27 | Disposition: A | Discharge status: Home / Self Care | Payer: No Typology Code available for payment source | Attending: Emergency Medicine | Admitting: Emergency Medicine

## 2015-10-27 DIAGNOSIS — M25511 Pain in right shoulder: Secondary | ICD-10-CM | POA: Insufficient documentation

## 2015-10-27 DIAGNOSIS — M545 Low back pain, unspecified: Secondary | ICD-10-CM

## 2015-10-27 DIAGNOSIS — Z8659 Personal history of other mental and behavioral disorders: Secondary | ICD-10-CM | POA: Diagnosis not present

## 2015-10-27 DIAGNOSIS — F17210 Nicotine dependence, cigarettes, uncomplicated: Secondary | ICD-10-CM | POA: Diagnosis not present

## 2015-10-27 DIAGNOSIS — Z87828 Personal history of other (healed) physical injury and trauma: Secondary | ICD-10-CM | POA: Diagnosis not present

## 2015-10-27 MED ORDER — CYCLOBENZAPRINE HCL 10 MG PO TABS: 10.0000 mg | ORAL_TABLET | Freq: Two times a day (BID) | ORAL | Status: DC | PRN

## 2015-10-27 MED ORDER — DICLOFENAC SODIUM 50 MG PO TBEC: 50.0000 mg | DELAYED_RELEASE_TABLET | Freq: Two times a day (BID) | ORAL | Status: DC

## 2015-10-27 NOTE — Discharge Instructions (Signed)
Do not drive while taking the muscle relaxant as it will make you sleepy. °
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
## 2015-10-27 NOTE — ED Notes (Signed)
Declined W/C at D/C and was escorted to lobby by RN. 

## 2015-12-09 ENCOUNTER — Emergency Department (HOSPITAL_COMMUNITY): Payer: No Typology Code available for payment source

## 2015-12-09 ENCOUNTER — Emergency Department (HOSPITAL_COMMUNITY)
Admission: EM | Admit: 2015-12-09 | Discharge: 2015-12-09 | Disposition: A | Discharge status: Home / Self Care | Payer: No Typology Code available for payment source | Attending: Emergency Medicine | Admitting: Emergency Medicine

## 2015-12-09 ENCOUNTER — Encounter (HOSPITAL_COMMUNITY): Payer: Self-pay | Admitting: Emergency Medicine

## 2015-12-09 DIAGNOSIS — M549 Dorsalgia, unspecified: Secondary | ICD-10-CM

## 2015-12-09 DIAGNOSIS — M545 Low back pain: Secondary | ICD-10-CM | POA: Insufficient documentation

## 2015-12-09 DIAGNOSIS — F17210 Nicotine dependence, cigarettes, uncomplicated: Secondary | ICD-10-CM | POA: Insufficient documentation

## 2015-12-09 LAB — POC URINE PREG, ED: PREG TEST UR: NEGATIVE

## 2015-12-09 MED ORDER — METHOCARBAMOL 500 MG PO TABS: 500.0000 mg | ORAL_TABLET | Freq: Four times a day (QID) | ORAL | Status: DC | PRN

## 2015-12-09 MED ORDER — LIDO-CAPSAICIN-MEN-METHYL SAL 0.5-0.035-5-20 % EX PTCH: 1.0000 | MEDICATED_PATCH | Freq: Two times a day (BID) | CUTANEOUS | Status: DC | PRN

## 2015-12-09 NOTE — ED Notes (Signed)
Placed patient on monitor
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
## 2015-12-09 NOTE — Discharge Instructions (Signed)
Read the information below.  Use the prescribed medication as directed.  Please discuss all new medications with your pharmacist.  You may return to the Emergency Department at any time for worsening condition or any new symptoms that concern you.     If you develop fevers, loss of control of bowel or bladder, weakness or numbness in your legs, or are unable to walk, return to the ER for a recheck.    Back Pain, Adult Back pain is very common in adults.The cause of back pain is rarely dangerous and the pain often gets better over time.The cause of your back pain may not be known. Some common causes of back pain include: 1. Strain of the muscles or ligaments supporting the spine. 2. Wear and tear (degeneration) of the spinal disks. 3. Arthritis. 4. Direct injury to the back. For many people, back pain may return. Since back pain is rarely dangerous, most people can learn to manage this condition on their own. HOME CARE INSTRUCTIONS Watch your back pain for any changes. The following actions may help to lessen any discomfort you are feeling: 1. Remain active. It is stressful on your back to sit or stand in one place for long periods of time. Do not sit, drive, or stand in one place for more than 30 minutes at a time. Take short walks on even surfaces as soon as you are able.Try to increase the length of time you walk each day. 2. Exercise regularly as directed by your health care provider. Exercise helps your back heal faster. It also helps avoid future injury by keeping your muscles strong and flexible. 3. Do not stay in bed.Resting more than 1-2 days can delay your recovery. 4. Pay attention to your body when you bend and lift. The most comfortable positions are those that put less stress on your recovering back. Always use proper lifting techniques, including: 1. Bending your knees. 2. Keeping the load close to your body. 3. Avoiding twisting. 5. Find a comfortable position to sleep. Use a  firm mattress and lie on your side with your knees slightly bent. If you lie on your back, put a pillow under your knees. 6. Avoid feeling anxious or stressed.Stress increases muscle tension and can worsen back pain.It is important to recognize when you are anxious or stressed and learn ways to manage it, such as with exercise. 7. Take medicines only as directed by your health care provider. Over-the-counter medicines to reduce pain and inflammation are often the most helpful.Your health care provider may prescribe muscle relaxant drugs.These medicines help dull your pain so you can more quickly return to your normal activities and healthy exercise. 8. Apply ice to the injured area: 1. Put ice in a plastic bag. 2. Place a towel between your skin and the bag. 3. Leave the ice on for 20 minutes, 2-3 times a day for the first 2-3 days. After that, ice and heat may be alternated to reduce pain and spasms. 9. Maintain a healthy weight. Excess weight puts extra stress on your back and makes it difficult to maintain good posture. SEEK MEDICAL CARE IF: 1. You have pain that is not relieved with rest or medicine. 2. You have increasing pain going down into the legs or buttocks. 3. You have pain that does not improve in one week. 4. You have night pain. 5. You lose weight. 6. You have a fever or chills. SEEK IMMEDIATE MEDICAL CARE IF:  1. You develop new bowel or bladder  control problems. 2. You have unusual weakness or numbness in your arms or legs. 3. You develop nausea or vomiting. 4. You develop abdominal pain. 5. You feel faint.   This information is not intended to replace advice given to you by your health care provider. Make sure you discuss any questions you have with your health care provider.   Document Released: 05/24/2005 Document Revised: 06/14/2014 Document Reviewed: 09/25/2013 Elsevier Interactive Patient Education 2016 Elsevier Inc.  Back Exercises If you have pain in your  back, do these exercises 2-3 times each day or as told by your doctor. When the pain goes away, do the exercises once each day, but repeat the steps more times for each exercise (do more repetitions). If you do not have pain in your back, do these exercises once each day or as told by your doctor. EXERCISES Single Knee to Chest Do these steps 3-5 times in a row for each leg: 5. Lie on your back on a firm bed or the floor with your legs stretched out. 6. Bring one knee to your chest. 7. Hold your knee to your chest by grabbing your knee or thigh. 8. Pull on your knee until you feel a gentle stretch in your lower back. 9. Keep doing the stretch for 10-30 seconds. 10. Slowly let go of your leg and straighten it. Pelvic Tilt Do these steps 5-10 times in a row: 10. Lie on your back on a firm bed or the floor with your legs stretched out. 11. Bend your knees so they point up to the ceiling. Your feet should be flat on the floor. 12. Tighten your lower belly (abdomen) muscles to press your lower back against the floor. This will make your tailbone point up to the ceiling instead of pointing down to your feet or the floor. 13. Stay in this position for 5-10 seconds while you gently tighten your muscles and breathe evenly. Cat-Cow Do these steps until your lower back bends more easily: 7. Get on your hands and knees on a firm surface. Keep your hands under your shoulders, and keep your knees under your hips. You may put padding under your knees. 8. Let your head hang down, and make your tailbone point down to the floor so your lower back is round like the back of a cat. 9. Stay in this position for 5 seconds. 10. Slowly lift your head and make your tailbone point up to the ceiling so your back hangs low (sags) like the back of a cow. 11. Stay in this position for 5 seconds. Press-Ups Do these steps 5-10 times in a row: 6. Lie on your belly (face-down) on the floor. 7. Place your hands near your  head, about shoulder-width apart. 8. While you keep your back relaxed and keep your hips on the floor, slowly straighten your arms to raise the top half of your body and lift your shoulders. Do not use your back muscles. To make yourself more comfortable, you may change where you place your hands. 9. Stay in this position for 5 seconds. 10. Slowly return to lying flat on the floor. Bridges Do these steps 10 times in a row: 1. Lie on your back on a firm surface. 2. Bend your knees so they point up to the ceiling. Your feet should be flat on the floor. 3. Tighten your butt muscles and lift your butt off of the floor until your waist is almost as high as your knees. If you do not  feel the muscles working in your butt and the back of your thighs, slide your feet 1-2 inches farther away from your butt. 4. Stay in this position for 3-5 seconds. 5. Slowly lower your butt to the floor, and let your butt muscles relax. If this exercise is too easy, try doing it with your arms crossed over your chest. Belly Crunches Do these steps 5-10 times in a row: 1. Lie on your back on a firm bed or the floor with your legs stretched out. 2. Bend your knees so they point up to the ceiling. Your feet should be flat on the floor. 3. Cross your arms over your chest. 4. Tip your chin a little bit toward your chest but do not bend your neck. 5. Tighten your belly muscles and slowly raise your chest just enough to lift your shoulder blades a tiny bit off of the floor. 6. Slowly lower your chest and your head to the floor. Back Lifts Do these steps 5-10 times in a row: 1. Lie on your belly (face-down) with your arms at your sides, and rest your forehead on the floor. 2. Tighten the muscles in your legs and your butt. 3. Slowly lift your chest off of the floor while you keep your hips on the floor. Keep the back of your head in line with the curve in your back. Look at the floor while you do this. 4. Stay in this  position for 3-5 seconds. 5. Slowly lower your chest and your face to the floor. GET HELP IF:  Your back pain gets a lot worse when you do an exercise.  Your back pain does not lessen 2 hours after you exercise. If you have any of these problems, stop doing the exercises. Do not do them again unless your doctor says it is okay. GET HELP RIGHT AWAY IF:  You have sudden, very bad back pain. If this happens, stop doing the exercises. Do not do them again unless your doctor says it is okay.   This information is not intended to replace advice given to you by your health care provider. Make sure you discuss any questions you have with your health care provider.   Document Released: 06/26/2010 Document Revised: 02/12/2015 Document Reviewed: 07/18/2014 Elsevier Interactive Patient Education 2016 [employer].   [employer] Assistance The United Ways 211 is a great source of information about community services available.  Access by dialing 2-1-1 from anywhere in [place], or by website -  PooledIncome.pl.   Other Local Resources (Updated 06/2015)  Financial Assistance   Services    Phone Number and Address  [facility]  Low-cost medical care - 1st and 3rd Saturday of every month  Must not qualify for public or private insurance and must have limited income [phone] [address] [place], [place]    [place] [employer] of Social Services  Child care  Emergency assistance for housing and [employer]  Medicaid [phone] [address] [place], [place] [address]   [facility]  Low-cost medical care for children, communicable diseases, sexually-transmitted diseases, immunizations, maternity care, womens health and family planning [phone] [address] [place], [place] [address]  [facility]   Medication  assistance for [facility] residents  Must meet income requirements [phone] [address] [place], [place].    [facility]  Child care  Emergency assistance for housing and [employer]  stamps  Medicaid [phone] [address] [place], [place] [address]  [facility]   Low-cost medical care,   Monday through Friday, 9 am to 6 pm.   Accepts Medicare/Medicaid, and self-pay [phone] [address] [place], [place] [address]  [facility]  Low-cost medical care - Monday through Friday, 8:30 am - 5:30 pm  Accepts Medicaid and self-pay [phone] [address] [place], [place] [address]   [facility]  Primary medical care, including for those with sickle cell disease  Accepts Medicare, Medicaid, insurance and self-pay [phone] [address] [place], [place]  [facility]   Primary medical care  Accepts Medicare, [place], insurance and self-pay [phone] [address] [place], [place] [address]   [facility]  Child care  Emergency assistance for housing and [employer]ilities  Food stamps  Medicaid [phone] [address] [place], [place] [address]  [facility] and [employer] Services  Child care  Emergency assistance for housing and [employer]ark[employer]ilities  Food stamps  Medicaid [phone] [address] [place], [place] [address]   [facility]  Medication assistance for [facility] residents with no insurance only  Must have a primary care doctor [phone] [address] [place], [place]  [facility]   Primary medical care  [place] Medicare, [place], insurance  [phone] [address] [place], [place]  [facility]   Medication assistance [phone]  [facility]   Primary medical care  Accepts Medicare, [place], insurance and self-pay [phone] [address] [place], [place] [address]  [facility]   Primary medical care  Accepts Medicare, [place], insurance and self-pay [phone] [address] [place], [place] [address]  [facility]  For [place] residents between the ages of 3218 and 3564 who do not have any form of health insurance, Medicare, [place], or [place] benefits.  Services are provided free of charge to uninsured patients who fall within federal poverty guidelines.    Hours: Tuesdays and Thursdays, 4:15 - 8 pm [phone] [address], [address] [place], [place] [address]  [facility]     Primary medical care  Dental care  Nutritional counseling  Pharmacy  Accepts Medicaid, Medicare, most insurance.  Fees are adjusted based on ability to pay.   [phone] [facility] [address] [place], [place]  [phone] [facility] [address] [place], [place]  [phone] [facility] [place], [place]  [phone] [facility], [address] [place], [place]  [phone] [facility] [address] [place], [place]  [facility]  Womens health and family planning [phone] [address] [place], [place]  [facility] of Social Services  Child care  Emergency assistance for housing and [employer]ly[employer]arkutilities  Food stamps  Medicaid [phone] [address], [place], [place] [address]   [facility]    Ages 8118 and older  Hours: Mondays and Thursdays, 7:00 am - 9:00 am Patients are seen on a first come, first served basis. [phone], [phone] [place], [place]  [facility] of Social Services  Child care  Emergency assistance for housing and  [employer]ilities  Food stamps  Medicaid [phone] [address] [place], [place] [address]  [facility] [employer] [facility]  Medication assistance  Rental assistance  Food pantry  Medication assistance  Housing assistance  Emergency food distribution  Utility assistance [phone] [address] [place], [place]  [phone]  [address] [place], [place] [address] Hours: Tuesdays and Thursdays from 9am - 12 noon by appointment only  [phone] [address] [place], [place] [address]  [facility] Lanae Boast   Accepts private insurance, [place], and [place].  Payment is based on a sliding scale for those without insurance.  Hours: Mondays, Tuesdays and Thursdays, 8:30 am - 5:30 pm.   [phone] [address] [place], [place]  [facility] - Family Medicine at [facility], [place], and [place].  Payment is based on a sliding scale for those without insurance. [phone] [address] [place], [place]  [facility] - Pediatrics at E. Scientist, research (physical sciences), [employer], and [place].  Payment is based on a sliding scale for those without insurance [phone] [address], [employer], [place]  [facility] - Pediatrics at [employer], [place], and [place].  Payment is based on a sliding scale for those without insurance. [phone] [address] [place], [place]  [facility] - Pediatrics at [facility], [place], and [place].  Payment is based on a sliding scale for those without insurance. [phone], [phone] [place], [place].    [facility]  Maternity care.  Accepts Medicaid and self-pay. [phone] [address] [place], [place]

## 2016-04-22 ENCOUNTER — Encounter (HOSPITAL_COMMUNITY): Payer: Self-pay | Admitting: Emergency Medicine

## 2016-04-22 ENCOUNTER — Emergency Department (HOSPITAL_COMMUNITY)
Admission: EM | Admit: 2016-04-22 | Discharge: 2016-04-22 | Disposition: A | Discharge status: Home / Self Care | Payer: Self-pay | Attending: Emergency Medicine | Admitting: Emergency Medicine

## 2016-04-22 ENCOUNTER — Telehealth: Payer: Self-pay | Admitting: *Deleted

## 2016-04-22 DIAGNOSIS — F17210 Nicotine dependence, cigarettes, uncomplicated: Secondary | ICD-10-CM | POA: Insufficient documentation

## 2016-04-22 DIAGNOSIS — E041 Nontoxic single thyroid nodule: Secondary | ICD-10-CM | POA: Insufficient documentation

## 2016-04-22 LAB — CBC
HEMATOCRIT: 44 % (ref 36.0–46.0)
Hemoglobin: 14.5 g/dL (ref 12.0–15.0)
MCH: 27.2 pg (ref 26.0–34.0)
MCHC: 33 g/dL (ref 30.0–36.0)
MCV: 82.6 fL (ref 78.0–100.0)
PLATELETS: 291 10*3/uL (ref 150–400)
RBC: 5.33 MIL/uL — ABNORMAL HIGH (ref 3.87–5.11)
RDW: 12.6 % (ref 11.5–15.5)
WBC: 11.2 10*3/uL — AB (ref 4.0–10.5)

## 2016-04-22 LAB — TSH: TSH: 1.069 u[IU]/mL (ref 0.350–4.500)

## 2016-04-22 LAB — T4, FREE: Free T4: 0.82 ng/dL (ref 0.61–1.12)

## 2016-04-22 LAB — BASIC METABOLIC PANEL
ANION GAP: 7 (ref 5–15)
BUN: 8 mg/dL (ref 6–20)
CALCIUM: 9.1 mg/dL (ref 8.9–10.3)
CO2: 25 mmol/L (ref 22–32)
Chloride: 105 mmol/L (ref 101–111)
Creatinine, Ser: 0.63 mg/dL (ref 0.44–1.00)
GFR calc Af Amer: >60 mL/min (ref >60)
GFR calc non Af Amer: >60 mL/min (ref >60)
GLUCOSE: 118 mg/dL — AB (ref 65–99)
Potassium: 4.1 mmol/L (ref 3.5–5.1)
Sodium: 137 mmol/L (ref 135–145)

## 2016-04-22 NOTE — ED Notes (Signed)
Assessment : Pt. Neck a little swollen around the thyroid area. She sts it has gotten worse over the last few weeks.

## 2016-04-22 NOTE — ED Notes (Signed)
Hooked patient up to the monitor and into a gown waiting on provider

## 2016-04-22 NOTE — ED Notes (Signed)
This RN apologized for moving pt into hallway informed she is only waiting for labs. Pt verbalizes understanding and states it is perfectly fine. Pt offered drinks and snacks, pt accepted and remains pleased.

## 2016-04-22 NOTE — ED Provider Notes (Signed)
[facility]PT Provider Note   CSN: [id number] Arrival date & time: 04/22/16  0847     History   Chief Complaint Chief Complaint  Patient presents with  . Facial Swelling    NECK swelling    HPI Mindy Bradford is a 30 y.o. female.  HPI CC: neck swelling  Onset/Duration: 1 weeks Timing: constant Location: anterior Quality: swelling Severity: mild Modifying Factors:  Improved by: nothing  Worsened by: nothing Associated Signs/Symptoms:  Pertinent (+): nothing  Pertinent (-): fevers, chills, trouble swallowing or breathing, fatigue, palpitations, chest pain, neck pain. Context: denies recent infection, pregnancy. Mother has thyroid problems and cancer runs in the family.  Past Medical History:  Diagnosis Date  . 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 Smoker unmotivated to quit 08/27/2011    Past Surgical History:  Procedure Laterality Date  . CHOLECYSTECTOMY  2008  . LEEP  5 years ago   normal results  . WISDOM TOOTH EXTRACTION     age 30    OB History    Gravida Para Term Preterm AB Living   2 2 2     2    SAB TAB Ectopic Multiple Live Births           2       Home Medications    Prior to Admission medications   Medication Sig Start Date End Date Taking? Authorizing Provider  Lido-Capsaicin-Men-Methyl Sal 0.5-0.035-5-20 % PT[facility] Apply 1 patch topically 2 (two) times daily as needed (back pain). 12/09/15   Trixie DredgeEmily West, PA-C  methocarbamol (ROBAXIN) 500 MG tablet Take 1-2 tablets (500-1,000 mg total) by mouth every 6 (six) hours as needed for muscle spasms. 12/09/15   Trixie DredgeEmily West, PA-C    Family History Family History  Problem Relation Age of Onset  . Asthma Mother   . Asthma Sister   . ADD / ADHD Sister   . ADD / ADHD Brother   . Heart disease    . Cancer    . Diabetes    . Hyperlipidemia    . Hypertension      Social History Social History    Substance Use Topics  . Smoking status: Current Every Day Smoker    Packs/day: 0.25    Years: 8.00    Types: Cigarettes  . Smokeless tobacco: Never Used  . Alcohol use No     Allergies   Sulfa antibiotics   Review of Systems Review of Systems Ten systems are reviewed and are negative for acute change except as noted in the HPI   Physical Exam Updated Vital Signs BP 142/86 (BP Location: Right Arm)   Pulse 89   Temp 98 F (36.7 C) (Oral)   Resp 23   Ht 5\' 7"  (1.702 m)   Wt 242 lb (109.8 kg)   LMP 04/04/2016   SpO2 99%   BMI 37.90 kg/m   Physical Exam  Constitutional: She is oriented to person, place, and time. She appears well-developed and well-nourished. No distress.  HENT:  Head: Normocephalic and atraumatic.  Nose: Nose normal.  Eyes: Conjunctivae and EOM are normal. Pupils are equal, round, and reactive to light. Right eye exhibits no discharge. Left eye exhibits no discharge. No scleral icterus.  Neck: Normal range of motion. Neck supple. Thyroid mass (right sided; nontender) present.  Cardiovascular: Normal rate and regular rhythm.  Exam reveals no gallop and no friction rub.  No murmur heard. Pulmonary/Chest: Effort normal and breath sounds normal. No stridor. No respiratory distress. She has no rales.  Abdominal: Soft. She exhibits no distension. There is no tenderness.  Musculoskeletal: She exhibits no edema or tenderness.  Neurological: She is alert and oriented to person, place, and time.  Skin: Skin is warm and dry. No rash noted. She is not diaphoretic. No erythema.  Psychiatric: She has a normal mood and affect.  Vitals reviewed.    ED Treatments / Results  Labs (all labs ordered are listed, but only abnormal results are displayed) Labs Reviewed  CBC - Abnormal; Notable for the following:       Result Value   WBC 11.2 (*)    RBC 5.33 (*)    All other components within normal limits  BASIC METABOLIC PANEL - Abnormal; Notable for the  following:    Glucose, Bld 118 (*)    All other components within normal limits  TSH  T4, FREE    EKG  EKG Interpretation None       Radiology No results found.  Procedures Procedures (including critical care time)  Medications Ordered in ED Medications - No data to display   Initial Impression / Assessment and Plan / ED Course  I have reviewed the triage vital signs and the nursing notes.  Pertinent labs & imaging results that were available during my care of the patient were reviewed by me and considered in my medical decision making (see chart for details).  Clinical Course     Labs grossly within normal limits. Normal thyroid study panel. Nodule is nontender. No evidence of airway obstruction. Patient does not have a primary care provider. She [facility] to establish care with them. Case management was also consulted to provide the patient with assistance establishing care with a primary care provider for further workup and management  The patient is safe for discharge with strict return precautions.   Final Clinical Impressions(s) / ED Diagnoses   Final diagnoses:  Thyroid nodule   Disposition: Discharge  Condition: Good  I have discussed the results, Dx and Tx plan with the patient who expressed understanding and agree(s) with the plan. Discharge instructions discussed at great length. The patient was given strict return precautions who verbalized understanding of the instructions. No further questions at time of discharge.    Current Discharge Medication List      Follow Up: [facility] [address] [place] [id number] [phone] Call  For help establishing care with a care provider and for further work up of the possible thyroid nodule.      Nira ConnPedro Eduardo Vu Liebman, MD 04/22/16 [phone]

## 2016-04-22 NOTE — ED Triage Notes (Signed)
Pt states her neck started swelling a week ago. No difficulty swallowing/speaking/breathing. Pt states thyroid cancer runs in her family. No other symptoms

## 2016-05-04 ENCOUNTER — Inpatient Hospital Stay: Payer: Self-pay

## 2016-05-18 ENCOUNTER — Encounter: Payer: Self-pay | Admitting: Internal Medicine

## 2016-05-18 ENCOUNTER — Ambulatory Visit: Payer: Self-pay | Attending: Internal Medicine | Admitting: Internal Medicine

## 2016-05-18 DIAGNOSIS — Z833 Family history of diabetes mellitus: Secondary | ICD-10-CM | POA: Insufficient documentation

## 2016-05-18 DIAGNOSIS — E049 Nontoxic goiter, unspecified: Secondary | ICD-10-CM

## 2016-05-18 DIAGNOSIS — Z23 Encounter for immunization: Secondary | ICD-10-CM

## 2016-05-18 DIAGNOSIS — Z720 Tobacco use: Secondary | ICD-10-CM

## 2016-05-18 DIAGNOSIS — Z882 Allergy status to sulfonamides status: Secondary | ICD-10-CM | POA: Insufficient documentation

## 2016-05-18 DIAGNOSIS — Z809 Family history of malignant neoplasm, unspecified: Secondary | ICD-10-CM | POA: Insufficient documentation

## 2016-05-18 DIAGNOSIS — F17210 Nicotine dependence, cigarettes, uncomplicated: Secondary | ICD-10-CM | POA: Insufficient documentation

## 2016-05-18 DIAGNOSIS — Z8249 Family history of ischemic heart disease and other diseases of the circulatory system: Secondary | ICD-10-CM | POA: Insufficient documentation

## 2016-05-18 LAB — TSH: TSH: 0.95 m[IU]/L

## 2016-05-18 LAB — T3, FREE: T3 FREE: 3.2 pg/mL (ref 2.3–4.2)

## 2016-05-18 LAB — T4, FREE: FREE T4: 1.1 ng/dL (ref 0.8–1.8)

## 2016-05-18 NOTE — Progress Notes (Signed)
Mindy Cofferiffany Hendry, is a 30 y.o. female  ZOX:[id number]  [id number]  DOB - 03-24-1986  CC:  Chief Complaint  Patient presents with  . Follow-up       HPI: Mindy Bradford is a 30 y.o. female here today to establish medical care., new to clinic, has not seen regular PCP in very long time, most care obtained in ER.  Of note, pt recently in ED 11/16 for concerns of thyroid problems. She notes swollen neck, left sided at time, possible thyroid nodule. She is uninsured, and cannot afford thyroid us at this time.  Per pt, hx of "thyroid cancer" in family, mom required surgery to remove it.   Smokes 1/2 ppd since she was 30 yo.  Last papsmear at Health dept in July 2017 was neg.  Per pt, hx of abnml paps before.  Patient has No headache, No chest pain, No abdominal pain - No Nausea, No new weakness tingling or numbness, No Cough - SOB.  +obose, does not watch diet. Denies palpitations/weight loss/hot flashes/tachycardia.     Review of Systems: Per hpi, o/w all systems reviewed and negative.  Allergies  Allergen Reactions  . Sulfa Antibiotics Rash   Past Medical History:  Diagnosis Date  . 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 Lido-Capsaicin-Men-Methyl Sal 0.5-0.035-5-20 % PTCH Apply 1 patch topically 2 (two) times daily as needed (back pain). (Patient not taking: Reported on 05/18/2016) 30 patch 0  . methocarbamol (ROBAXIN) 500 MG tablet Take 1-2 tablets (500-1,000 mg total) by mouth every 6 (six) hours as needed for muscle spasms. (Patient not taking: Reported on 05/18/2016) 20 tablet 0   No current facility-administered medications on file prior to visit.    Family History  Problem Relation Age of Onset  . Asthma Mother   . Asthma Sister   . ADD / ADHD Sister   . ADD / ADHD Brother   . Heart disease    . Cancer    . Diabetes    .  Hyperlipidemia    . Hypertension     Social History   Social History  . Marital status: Single    Spouse name: N/A  . Number of children: N/A  . Years of education: N/A   Occupational History  . Not on file.   Social History Main Topics  . Smoking status: Current Every Day Smoker    Packs/day: 0.25    Years: 8.00    Types: Cigarettes  . Smokeless tobacco: Never Used  . Alcohol use No  . Drug use: No  . Sexual activity: Yes    Birth control/ protection: None     Comment: wants birth control   Other Topics Concern  . Not on file   Social History Narrative  . No narrative on file    Objective:   Vitals:   05/18/16 1010  BP: 127/84  Pulse: 86  Resp: 16  Temp: 98.6 F (37 C)    Filed Weights   05/18/16 1010  Weight: 244 lb 9.6 oz (110.9 kg)    BP Readings from Last 3 Encounters:  05/18/16 127/84  04/22/16 121/77  12/09/15 106/63    Physical Exam: Constitutional: Patient appears well-developed and well-nourished. No distress. AAOx3, obese, pleasant. HENT: Normocephalic, atraumatic, External right and left ear normal. Oropharynx is clear and moist.   +tonsils. Eyes: Conjunctivae and EOM are normal. PERRL, no  scleral icterus. bilat TMs clear. Neck: Normal ROM. Neck supple. No JVD. No tracheal deviation. Mild enlarged thyroid, on right.  No bruits. CVS: RRR, S1/S2 +, no murmurs, no gallops, no carotid bruit.  Pulmonary: Effort and breath sounds normal, no stridor, rhonchi, wheezes, rales.  Abdominal: Soft. BS +, no distension, tenderness, rebound or guarding.  Musculoskeletal: Normal range of motion. No edema and no tenderness.  LE: bilat/ no c/c/e, pulses 2+ bilateral. Lymphadenopathy: No lymphadenopathy noted, cervical Neuro: Alert.  muscle tone coordination wnl. No cranial nerve deficit grossly. Skin: Skin is warm and dry. No rash noted. Not diaphoretic. No erythema. No pallor. Psychiatric: Normal mood and affect. Behavior, judgment, thought content  normal.  Lab Results  Component Value Date   WBC 11.2 (H) 04/22/2016   HGB 14.5 04/22/2016   HCT 44.0 04/22/2016   MCV 82.6 04/22/2016   PLT 291 04/22/2016   Lab Results  Component Value Date   CREATININE 0.63 04/22/2016   BUN 8 04/22/2016   NA 137 04/22/2016   K 4.1 04/22/2016   CL 105 04/22/2016   CO2 25 04/22/2016    No results found for: HGBA1C Lipid Panel  No results found for: CHOL, TRIG, HDL, CHOLHDL, VLDL, LDLCALC      [facility]/18/2016  Decreased Interest 0  Down, Depressed, Hopeless 0  PHQ - 2 Score 0    Assessment and plan:     1. Goiter, enlarged thyroid on Right. - recd thyroid us, pt uninsured, wants to hold off until gets financial aid - TSH - T4, Free - T3, Free   2. Morbid obesity (HCC) Weight loss encouraged w/ healthy diet/exercise, info on low carb diet provided  3. Tobacco abuse .5 ppd x 18 years, total cessation recd, tips provided. - pneumococcal 23v today.  4. Flu vac today.  Return in about 3 months (around 08/16/2016).  The patient was given clear instructions to go to ER or return to medical center if symptoms don't improve, worsen or new problems develop. The patient verbalized understanding. The patient was told to call to get lab results if they haven't heard anything in the next week.    This note has been created with Education officer, environmentalDragon speech recognition software and smart phrase technology. Any transcriptional errors are unintentional.   Pete Glatterawn T Jatorian Renault, MD, MBA/MHA [facility] [place], [place] [phone]   05/18/2016, 11:21 AM

## 2016-05-18 NOTE — Patient Instructions (Addendum)
Financial packet  -  QUICK START PATIENT GUIDE TO LCHF/IF LOW CARB HIGH FAT / INTERMITTENT FASTING  Recommend: <50 gram carbohydrate a day for weightloss.  What is this diet and how does it work? o Insulin is a hormone made by your body that allows you to use sugar (glucose) from carbohydrates in the food you eat for energy or to store glucose (as fat) for future use  o Insulin levels need to be lowered in order to utilize our stored energy (fat) o Many struggling with obesity are insulin resistant and have high levels of insulin o This diet works to lower your insulin in two ways o Fasting - allows your insulin levels to naturally decrease  o Avoiding carbohydrates - carbs trigger increase in insulin Low Carb Healthy Fat (LCHF) o Get a free app for your phone, such as MyFitnessPal, to help you track your macronutrients (carbs/protein/fats) and to track your weight and body measurements to see your progress o Set your goal for around 10% carbs/20% protein/70% fat o A good starting goal for amount of net carbs per day is 50 grams (some will aim for 20 grams) o "Net carbs" refers to total grams of carbs minus grams of fiber (as fiber is not typically absorbed). For example, if a food has 5g total carb and 3g fiber, that would be 2g net carbs o Increase healthy fats - eg. olive oil, eggs, nuts, avocado, cheese, butter, coconut, meats, fish o Avoid high carb foods - eg. bread, pasta, potatoes, rice, cookies, soda, juice, anything sugary o Buy full-fat ingredients (avoid low-fat versions, which often have more sugar) o No need to count calories, but pay close attention to grams of carbs on labels Intermittent Fasting (IF) o "Fasting" is going a period of time without eating - it helps to stay busy and well-hydrated o Purpose of fasting is to allow insulin levels to drop as low as possible, allowing your body to switch into fat-burning mode o With this diet there are many approaches to fasting,  but 16:8 and 24hr fasts are commonly used o 16:8 fast, usually 5-7 days a week - Fasting for 16 hours of the day, then eating all meals for the day over course of 8 hours. o 24 hour fast, usually 1-3 days a week - Typically eating one meal a day, then fasting until the next day. Plenty of fluids (and some salt to help you hold onto fluids) are recommended during longer fasts.  o During fasts certain beverages are still acceptable - water, sparkling water, bone broth, black tea or coffee, or tea/coffee with small amount of heavy whipping cream Special note for those on diabetic medications o Discuss your medications with your physician. You may need to hold your medication or adjust to only taking when eating. Diabetics should keep close track of their blood sugars when making any changes to diet/meds, to ensure they are staying within normal limits For more info about LCHF/IF o Watch "Therapeutic Fasting - Solving the Two-Compartment Problem" video by Dr. Wylene SimmerJason Fung on YouTube (SatelliteSeeker.nohttps://www.[email or web address]/watch?v=[email or web address]) for a great intro to these concepts o Read "The Obesity Code" and/or "The Complete Guide to Fasting" by Dr. Wylene SimmerJason Fung o Go to www.dietdoctor.com for explanations, recipes, and infographics about foods to eat/avoid o Get a Free smartphone app that helps count carbohydrates  - ie MyKeto EXAMPLES TO GET STARTED Fasting Beverages -water (can add  tsp Pink Himalayan salt once or twice a day to help stay hydrated for longer  fasts) -Sparkling water (such as [employer] or similar; avoid any with artificial sweeteners)  -Bone broth (multiple recipes available online or can buy pre-made) -Tea or Coffee (Adding heavy whipping cream or coconut oil to your tea or coffee can be helpful if you find yourself getting too hungry during the fasts. Can also add cinnamon for flavor. Or "bulletproof coffee.") Low Carb Healthy Fat Breakfast (if not fasting) -eggs in butter or olive oil with  avocado -omelet with veggies and cheese  Lunch -hamburger with cheese and avocado wrapped in lettuce (no bun, no ketchup) -meat and cheese wrapped in lettuce (can dip in mustard or olive oil/vinegar/mayo) -salad with meat/cheese/nuts and higher fat dressing (vinaigrette or Ranch, etc) -tuna salad lettuce wrap -taco meat with cheese, sour cream, guacamole, cheese over lettuce  Dinner -steak with herb butter or Barnaise sauce -"Fathead" pizza (uses cheese and almond flour for the dough - several recipes available online) -roasted or grilled chicken with skin on, with low carb sauce (buffalo, garlic butter, alfredo, pesto, etc) -baked salmon with lemon butter -chicken alfredo with zucchini noodles -[place] butter chicken with low carb garlic naan -egg roll in a bowl  Side Dishes -mashed cauliflower (homemade or available in freezer section) -roast vegetables (green veggies that grow above ground rather than root veggies) with butter or cheese -Caprese salad (fresh mozzarella, tomato and basil with olive oil) -homemade low-carb coleslaw Snacks/Desserts (try to avoid unnecessary snacking and sweets in general) -celery or cucumber dipped in guacamole or sour cream dip -cheese and meat slices  -raspberries with whipped cream (can make homemade with no sugar added) -low carb [place] butter cake  AVOID - sugar, diet/regular soda, potatoes, breads, rice, pasta, candy, cookies, cakes, muffins, juice, high carb fruit (bananas, grapes), beer, ketchup, barbeque and other sweet sauces  -   Steps to Quit Smoking Smoking tobacco can be bad for your health. It can also affect almost every organ in your body. Smoking puts you and people around you at risk for many serious long-lasting (chronic) diseases. Quitting smoking is hard, but it is one of the best things that you can do for your health. It is never too late to quit. What are the benefits of quitting smoking? When you quit smoking, you  lower your risk for getting serious diseases and conditions. They can include:  Lung cancer or lung disease.  Heart disease.  Stroke.  Heart attack.  Not being able to have children (infertility).  Weak bones (osteoporosis) and broken bones (fractures). If you have coughing, wheezing, and shortness of breath, those symptoms may get better when you quit. You may also get sick less often. If you are pregnant, quitting smoking can help to lower your chances of having a baby of low birth weight. What can I do to help me quit smoking? Talk with your doctor about what can help you quit smoking. Some things you can do (strategies) include:  Quitting smoking totally, instead of slowly cutting back how much you smoke over a period of time.  Going to in-person counseling. You are more likely to quit if you go to many counseling sessions.  Using resources and support systems, such as:  Online chats with a Veterinary surgeoncounselor.  Phone quitlines.  Printed Materials engineerself-help materials.  Support groups or group counseling.  Text messaging programs.  Mobile phone apps or applications.  Taking medicines. Some of these medicines may have nicotine in them. If you are pregnant or breastfeeding, do not take any medicines to quit smoking  unless your doctor says it is okay. Talk with your doctor about counseling or other things that can help you. Talk with your doctor about using more than one strategy at the same time, such as taking medicines while you are also going to in-person counseling. This can help make quitting easier. What things can I do to make it easier to quit? Quitting smoking might feel very hard at first, but there is a lot that you can do to make it easier. Take these steps:  Talk to your family and friends. Ask them to support and encourage you.  Call phone quitlines, reach out to support groups, or work with a Veterinary surgeon.  Ask people who smoke to not smoke around you.  Avoid places that make  you want (trigger) to smoke, such as:  Bars.  Parties.  Smoke-break areas at work.  Spend time with people who do not smoke.  Lower the stress in your life. Stress can make you want to smoke. Try these things to help your stress:  Getting regular exercise.  Deep-breathing exercises.  Yoga.  Meditating.  Doing a body scan. To do this, close your eyes, focus on one area of your body at a time from head to toe, and notice which parts of your body are tense. Try to relax the muscles in those areas.  Download or buy apps on your mobile phone or tablet that can help you stick to your quit plan. There are many free apps, such as QuitGuide from the [employer] developer for Disease Control and Prevention). You can find more support from smokefree.gov and other websites. This information is not intended to replace advice given to you by your health care provider. Make sure you discuss any questions you have with your health care provider. Document Released: 03/20/2009 Document Revised: 01/20/2016 Document Reviewed: 10/08/2014 Elsevier Interactive Patient Education  2017 Elsevier Inc. Pneumococcal Polysaccharide Vaccine: What You Need to Know 1. Why get vaccinated? Vaccination can protect older adults (and some children and younger adults) from pneumococcal disease. Pneumococcal disease is caused by bacteria that can spread from person to person through close contact. It can cause ear infections, and it can also lead to more serious infections of the:  Lungs (pneumonia),  Blood (bacteremia), and  Covering of the brain and spinal cord (meningitis). Meningitis can cause deafness and brain damage, and it can be fatal. Anyone can get pneumococcal disease, but children under 98 years of age, people with certain medical conditions, adults over 39 years of age, and cigarette smokers are at the highest risk. About 18,000 older adults die each year from pneumococcal disease in the [place]. Treatment  of pneumococcal infections with penicillin and other drugs used to be more effective. But some strains of the disease have become resistant to these drugs. This makes prevention of the disease, through vaccination, even more important. 2. Pneumococcal polysaccharide vaccine (PPSV23) Pneumococcal polysaccharide vaccine (PPSV23) protects against 23 types of pneumococcal bacteria. It will not prevent all pneumococcal disease. PPSV23 is recommended for:  All adults 46 years of age and older,  Anyone 2 through 30 years of age with certain long-term health problems,  Anyone 2 through 30 years of age with a weakened immune system,  Adults 65 through 30 years of age who smoke cigarettes or have asthma. Most people need only one dose of PPSV. A second dose is recommended for certain high-risk groups. People 64 and older should get a dose even if they have gotten one or  more doses of the vaccine before they turned 65. Your healthcare provider can give you more information about these recommendations. Most healthy adults develop protection within 2 to 3 weeks of getting the shot. 3. Some people should not get this vaccine  Anyone who has had a life-threatening allergic reaction to PPSV should not get another dose.  Anyone who has a severe allergy to any component of PPSV should not receive it. Tell your provider if you have any severe allergies.  Anyone who is moderately or severely ill when the shot is scheduled may be asked to wait until they recover before getting the vaccine. Someone with a mild illness can usually be vaccinated.  Children less than 18 years of age should not receive this vaccine.  There is no evidence that PPSV is harmful to either a pregnant woman or to her fetus. However, as a precaution, women who need the vaccine should be vaccinated before becoming pregnant, if possible. 4. Risks of a vaccine reaction With any medicine, including vaccines, there is a chance of side effects.  These are usually mild and go away on their own, but serious reactions are also possible. About half of people who get PPSV have mild side effects, such as redness or pain where the shot is given, which go away within about two days. Less than 1 out of 100 people develop a fever, muscle aches, or more severe local reactions. Problems that could happen after any vaccine:  People sometimes faint after a medical procedure, including vaccination. Sitting or lying down for about 15 minutes can help prevent fainting, and injuries caused by a fall. Tell your doctor if you feel dizzy, or have vision changes or ringing in the ears.  Some people get severe pain in the shoulder and have difficulty moving the arm where a shot was given. This happens very rarely.  Any medication can cause a severe allergic reaction. Such reactions from a vaccine are very rare, estimated at about 1 in a million doses, and would happen within a few minutes to a few hours after the vaccination. As with any medicine, there is a very remote chance of a vaccine causing a serious injury or death. The safety of vaccines is always being monitored. For more information, visit: http://floyd.org/ 5. What if there is a serious reaction? What should I look for? Look for anything that concerns you, such as signs of a severe allergic reaction, very high fever, or unusual behavior. Signs of a severe allergic reaction can include hives, swelling of the face and throat, difficulty breathing, a fast heartbeat, dizziness, and weakness. These would usually start a few minutes to a few hours after the vaccination. What should I do? If you think it is a severe allergic reaction or other emergency that can't wait, call 9-1-1 or get to the nearest hospital. Otherwise, call your doctor. Afterward, the reaction should be reported to the Vaccine Adverse Event Reporting System (VAERS). Your doctor might file this report, or you can do it yourself  through the VAERS web site at www.vaers.LAgents.no, or by calling 1-9348111576. VAERS does not give medical advice. 6. How can I learn more?  Ask your doctor. He or she can give you the vaccine package insert or suggest other sources of information.  Call your local or state health department.  Contact the Centers for Disease Control and Prevention (CDC):  Call (517)535-1829 (1-800-CDC-INFO) or  Visit CDC's website at PicCapture.uy CDC Pneumococcal Polysaccharide Vaccine VIS (09/28/13) This information is  not intended to replace advice given to you by your health care provider. Make sure you discuss any questions you have with your health care provider. Document Released: 03/21/2006 Document Revised: 02/12/2016 Document Reviewed: 02/12/2016 Elsevier Interactive Patient Education  2017 Elsevier Inc. Influenza Virus Vaccine injection (Fluarix) What is this medicine? INFLUENZA VIRUS VACCINE (in floo EN zuh VAHY ruhs vak SEEN) helps to reduce the risk of getting influenza also known as the flu. This medicine may be used for other purposes; ask your health care provider or pharmacist if you have questions. COMMON BRAND NAME(S): Fluarix, Fluzone What should I tell my health care provider before I take this medicine? They need to know if you have any of these conditions: -bleeding disorder like hemophilia -fever or infection -Guillain-Barre syndrome or other neurological problems -immune system problems -infection with the human immunodeficiency virus (HIV) or AIDS -low blood platelet counts -multiple sclerosis -an unusual or allergic reaction to influenza virus vaccine, eggs, chicken proteins, latex, gentamicin, other medicines, foods, dyes or preservatives -pregnant or trying to get pregnant -breast-feeding How should I use this medicine? This vaccine is for injection into a muscle. It is given by a health care professional. A copy of Vaccine Information Statements will be given  before each vaccination. Read this sheet carefully each time. The sheet may change frequently. Talk to your pediatrician regarding the use of this medicine in children. Special care may be needed. Overdosage: If you think you have taken too much of this medicine contact a poison control center or emergency room at once. NOTE: This medicine is only for you. Do not share this medicine with others. What if I miss a dose? This does not apply. What may interact with this medicine? -chemotherapy or radiation therapy -medicines that lower your immune system like etanercept, anakinra, infliximab, and adalimumab -medicines that treat or prevent blood clots like warfarin -phenytoin -steroid medicines like prednisone or cortisone -theophylline -vaccines This list may not describe all possible interactions. Give your health care provider a list of all the medicines, herbs, non-prescription drugs, or dietary supplements you use. Also tell them if you smoke, drink alcohol, or use illegal drugs. Some items may interact with your medicine. What should I watch for while using this medicine? Report any side effects that do not go away within 3 days to your doctor or health care professional. Call your health care provider if any unusual symptoms occur within 6 weeks of receiving this vaccine. You may still catch the flu, but the illness is not usually as bad. You cannot get the flu from the vaccine. The vaccine will not protect against colds or other illnesses that may cause fever. The vaccine is needed every year. What side effects may I notice from receiving this medicine? Side effects that you should report to your doctor or health care professional as soon as possible: -allergic reactions like skin rash, itching or hives, swelling of the face, lips, or tongue Side effects that usually do not require medical attention (report to your doctor or health care professional if they continue or are  bothersome): -fever -headache -muscle aches and pains -pain, tenderness, redness, or swelling at site where injected -weak or tired This list may not describe all possible side effects. Call your doctor for medical advice about side effects. You may report side effects to FDA at 1-800-FDA-1088. Where should I keep my medicine? This vaccine is only given in a clinic, pharmacy, doctor's office, or other health care setting and will not  be stored at home. NOTE: This sheet is a summary. It may not cover all possible information. If you have questions about this medicine, talk to your doctor, pharmacist, or health care provider.  2017 Elsevier/Gold Standard (2007-12-20 09:30:40)

## 2016-05-19 ENCOUNTER — Telehealth: Payer: Self-pay

## 2016-05-19 NOTE — Telephone Encounter (Signed)
Pt returned call and went over lab results pt is aware pt states she will wait till feb to get the us

## 2016-05-19 NOTE — Telephone Encounter (Signed)
Contacted pt to go over lab results pt didn't answer lvm asking pt to give me a call back at her earliest convenience  

## 2016-07-06 ENCOUNTER — Ambulatory Visit: Payer: Self-pay

## 2016-07-16 ENCOUNTER — Ambulatory Visit: Payer: Self-pay

## 2016-10-21 ENCOUNTER — Encounter: Payer: Self-pay | Admitting: Internal Medicine

## 2016-10-22 ENCOUNTER — Encounter: Payer: Self-pay | Admitting: Internal Medicine

## 2016-10-25 ENCOUNTER — Encounter: Payer: Self-pay | Admitting: Internal Medicine

## 2016-11-08 ENCOUNTER — Encounter (HOSPITAL_COMMUNITY): Payer: Self-pay | Admitting: Emergency Medicine

## 2016-11-08 ENCOUNTER — Emergency Department (HOSPITAL_COMMUNITY)
Admission: EM | Admit: 2016-11-08 | Discharge: 2016-11-08 | Disposition: A | Discharge status: Home / Self Care | Payer: Self-pay | Attending: Emergency Medicine | Admitting: Emergency Medicine

## 2016-11-08 DIAGNOSIS — F17210 Nicotine dependence, cigarettes, uncomplicated: Secondary | ICD-10-CM | POA: Insufficient documentation

## 2016-11-08 DIAGNOSIS — G43009 Migraine without aura, not intractable, without status migrainosus: Secondary | ICD-10-CM

## 2016-11-08 DIAGNOSIS — G43719 Chronic migraine without aura, intractable, without status migrainosus: Secondary | ICD-10-CM | POA: Insufficient documentation

## 2016-11-08 DIAGNOSIS — F900 Attention-deficit hyperactivity disorder, predominantly inattentive type: Secondary | ICD-10-CM | POA: Insufficient documentation

## 2016-11-08 MED ORDER — ONDANSETRON 8 MG PO TBDP: 8.0000 mg | ORAL_TABLET | Freq: Three times a day (TID) | ORAL | 0 refills | Status: DC | PRN

## 2016-11-08 MED ORDER — KETOROLAC TROMETHAMINE 30 MG/ML IJ SOLN
30.0000 mg | Freq: Once | INTRAMUSCULAR | Status: AC
  Administered 2016-11-08: 30 mg via INTRAVENOUS
  Filled 2016-11-08: qty 1

## 2016-11-08 MED ORDER — METOCLOPRAMIDE HCL 5 MG/ML IJ SOLN
10.0000 mg | Freq: Once | INTRAMUSCULAR | Status: AC
  Administered 2016-11-08: 10 mg via INTRAVENOUS
  Filled 2016-11-08: qty 2

## 2016-11-08 MED ORDER — MORPHINE SULFATE (PF) 4 MG/ML IV SOLN
4.0000 mg | Freq: Once | INTRAVENOUS | Status: AC
  Administered 2016-11-08: 4 mg via INTRAVENOUS
  Filled 2016-11-08: qty 1

## 2016-11-08 MED ORDER — ONDANSETRON HCL 4 MG/2ML IJ SOLN
4.0000 mg | Freq: Once | INTRAMUSCULAR | Status: AC
Start: 2016-11-08 — End: 2016-11-08
  Administered 2016-11-08: 4 mg via INTRAVENOUS
  Filled 2016-11-08: qty 2

## 2016-11-08 MED ORDER — SODIUM CHLORIDE 0.9 % IV BOLUS (SEPSIS)
1000.0000 mL | Freq: Once | INTRAVENOUS | Status: AC
  Administered 2016-11-08: 1000 mL via INTRAVENOUS

## 2016-11-08 NOTE — ED Triage Notes (Signed)
Pt reports having a migraine since Friday, denies fevers, dizziness but reports nausea, light sensitivity, 10/10 pain.

## 2016-11-08 NOTE — ED Provider Notes (Signed)
[facility]-EMERGENCY DEPT Provider Note   CSN: [id number] Arrival date & time: 11/08/16  [id number]  By signing my name below, I, Mindy Bradford, attest that this documentation has been prepared under the direction and in the presence of physician practitioner, Azalia Bilisampos, Asheton Scheffler, MD. Electronically Signed: Linna Darnerussell Bradford, Scribe. 11/08/2016. 2:28 AM.  History   Chief Complaint Chief Complaint  Patient presents with  . Migraine   The history is provided by the patient. No language interpreter was used.    HPI Comments: Mindy Bradford is a 31 y.o. female who presents to the Emergency Department complaining of a constant, gradually worsening headache beginning three days ago. Patient states her current headache is consistent with her h/o chronic migraines but she has never been formally diagnosed with migraines. She notes her headache began in her frontal region and has migrated to her occipital scalp. Patient reports associated photophobia, phonophobia, and nausea without vomiting. She has tried Excedrin without improvement of her headache. No known causal factors. No recent injury or trauma to her head. Patient has been eating and drinking normally. She denies vision changes or any other associated symptoms.  Past Medical History:  Diagnosis Date  . 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 Smoker unmotivated to quit 08/27/2011    Past Surgical History:  Procedure Laterality Date  . CHOLECYSTECTOMY  2008  . LEEP  5 years ago   normal results  . WISDOM TOOTH EXTRACTION     age 31    OB History    Gravida Para Term Preterm AB Living   2 2 2     2    SAB TAB Ectopic Multiple Live Births           2       Home Medications    Prior to Admission medications   Medication Sig Start Date End Date Taking? Authorizing Provider  Lido-Capsaicin-Men-Methyl Sal 0.5-0.035-5-20 % PTCH Apply 1 patch topically 2 (two)  times daily as needed (back pain). Patient not taking: Reported on 05/18/2016 12/09/15   Trixie DredgeWest, Emily, PA-C  methocarbamol (ROBAXIN) 500 MG tablet Take 1-2 tablets (500-1,000 mg total) by mouth every 6 (six) hours as needed for muscle spasms. Patient not taking: Reported on 05/18/2016 12/09/15   Trixie DredgeWest, Emily, PA-C    Family History Family History  Problem Relation Age of Onset  . Asthma Mother   . Asthma Sister   . ADD / ADHD Sister   . ADD / ADHD Brother   . Heart disease Unknown   . Cancer Unknown   . Diabetes Unknown   . Hyperlipidemia Unknown   . Hypertension Unknown     Social History Social History  Substance Use Topics  . Smoking status: Current Every Day Smoker    Packs/day: 0.25    Years: 8.00    Types: Cigarettes  . Smokeless tobacco: Never Used  . Alcohol use No     Allergies   Sulfa antibiotics   Review of Systems Review of Systems All other systems reviewed and are negative for acute change except as noted in the HPI. Physical Exam Updated Vital Signs BP 124/76   Pulse 76   Temp 98.6 F (37 C) (Oral)   Resp 18   SpO2 95%   Physical Exam  Constitutional: She is oriented to person, place, and time. She appears well-developed and well-nourished.  HENT:  Head: Normocephalic and atraumatic.  Eyes:  Pupils are equal, round, and reactive to light.  Cardiovascular: Regular rhythm.   Pulmonary/Chest: Effort normal.  Abdominal: Soft.  Musculoskeletal: Normal range of motion.  Neurological: She is alert and oriented to person, place, and time.  5/5 strength in major muscle groups of bilateral upper and lower extremities. Speech normal. No facial asymmetry.   Nursing note and vitals reviewed.  ED Treatments / Results  Labs (all labs ordered are listed, but only abnormal results are displayed) Labs Reviewed - No data to display  EKG  EKG Interpretation None       Radiology No results found.  Procedures Procedures (including critical care  time)  DIAGNOSTIC STUDIES: Oxygen Saturation is 95% on RA, adequate by my interpretation.    COORDINATION OF CARE: 2:24 AM Discussed treatment plan with pt at bedside and pt agreed to plan.  Medications Ordered in ED Medications  metoCLOPramide (REGLAN) injection 10 mg (not administered)  ketorolac (TORADOL) 30 MG/ML injection 30 mg (not administered)  ondansetron (ZOFRAN) injection 4 mg (not administered)  sodium chloride 0.9 % bolus 1,000 mL (not administered)  morphine 4 MG/ML injection 4 mg (not administered)     Initial Impression / Assessment and Plan / ED Course  I have reviewed the triage vital signs and the nursing notes.  Pertinent labs & imaging results that were available during my care of the patient were reviewed by me and considered in my medical decision making (see chart for details).     3:21 AM Pt feels better at this time. Outpatient neuro follow up. Dc home in good condition  Final Clinical Impressions(s) / ED Diagnoses   Final diagnoses:  Migraine without aura and without status migrainosus, not intractable    New Prescriptions New Prescriptions   No medications on file   I personally performed the services described in this documentation, which was scribed in my presence. The recorded information has been reviewed and is accurate.       Azalia Bilis, MD 11/08/16 [phone]

## 2016-11-08 NOTE — ED Notes (Signed)
Pt understood dc material. NAD noted. Scripts given at dc 

## 2019-06-08 NOTE — L&D Delivery Note (Signed)
Delivery Note At 10:25 PM a viable and healthy female was delivered via Vaginal, Spontaneous (Presentation: Middle Occiput Anterior).  APGAR: , ; weight 4 lb 14.7 oz (2230 g).   Placenta status: Spontaneous;Pathology, Intact.  Cord: 3 vessels   Pt PPROMd around 2 am on the morning of admission. On admission she was comfortable without evidence of contractions. After several hours she began experiencing painful contractions that didn't resolve with IV hydration. Cervical exam was 4 cm. The decision was made to transfer to L&D and start magnesium to attempt tocolysis. Magnesium was not started prior to transfer and when the patient was settled on L&D her cervix was 6-7 cm. The decision was made to forgo tocolytic attempts due to rapid progression in labor. The patient delivered a vigorous female infant in vertex presentation. The infant cried after minor stimulation on the maternal abdomen. After a 1 minute delay in cord clamping the cord was clamped and cut and the infant was passed to the NICU team. The placenta delivered spontaneously, intact with 3VC. While awaiting the placental delivery there was some brisk bleeding noted. With placental delivery this improved with uterine massage and IM methergine. 2nd degree perineal laceration was repaired with 3-0 vicryl. Mom doing well after delivery, baby transferred to NICU  Anesthesia: Local Episiotomy: none Lacerations: 2nd degree perineal laceration   Suture Repair: 3.0 vicryl Est. Blood Loss (mL):  1062 gm  Mom to postpartum.  Baby to Couplet care / Skin to Skin.  Waynard Reeds 01/30/2020, 11:00 PM

## 2019-10-03 ENCOUNTER — Encounter: Payer: Medicaid Other | Attending: Obstetrics & Gynecology | Admitting: Registered"

## 2019-10-03 ENCOUNTER — Other Ambulatory Visit: Payer: Self-pay

## 2019-10-03 DIAGNOSIS — O99810 Abnormal glucose complicating pregnancy: Secondary | ICD-10-CM | POA: Insufficient documentation

## 2019-10-04 ENCOUNTER — Encounter: Payer: Self-pay | Admitting: Registered"

## 2019-10-04 DIAGNOSIS — O99810 Abnormal glucose complicating pregnancy: Secondary | ICD-10-CM | POA: Insufficient documentation

## 2019-10-04 NOTE — Progress Notes (Signed)
Patient was seen on 10/03/19 for Gestational Diabetes self-management class at [facility]. The following learning objectives were met by the patient during this course:   States the definition of Gestational Diabetes  States why dietary management is important in controlling blood glucose  Describes the effects each nutrient has on blood glucose levels  Demonstrates ability to create a balanced meal plan  Demonstrates carbohydrate counting   States when to check blood glucose levels  Demonstrates proper blood glucose monitoring techniques  States the effect of stress and exercise on blood glucose levels  States the importance of limiting caffeine and abstaining from alcohol and smoking  Blood glucose monitor given: Accu-chek Guide Me Lot #[id number] Exp: 11/06/2020 Blood Glucose: 170 mg/dL  Patient instructed to monitor glucose levels: FBS: 60 - <95; 1 hour: <140; 2 hour: <120  Patient received handouts:  Nutrition Diabetes and Pregnancy, including carb counting list  Patient will be seen for follow-up as needed.

## 2019-12-06 DIAGNOSIS — Z419 Encounter for procedure for purposes other than remedying health state, unspecified: Secondary | ICD-10-CM | POA: Diagnosis not present

## 2020-01-01 DIAGNOSIS — Z23 Encounter for immunization: Secondary | ICD-10-CM | POA: Diagnosis not present

## 2020-01-01 DIAGNOSIS — O24419 Gestational diabetes mellitus in pregnancy, unspecified control: Secondary | ICD-10-CM | POA: Diagnosis not present

## 2020-01-06 DIAGNOSIS — Z419 Encounter for procedure for purposes other than remedying health state, unspecified: Secondary | ICD-10-CM | POA: Diagnosis not present

## 2020-01-24 DIAGNOSIS — O24419 Gestational diabetes mellitus in pregnancy, unspecified control: Secondary | ICD-10-CM | POA: Diagnosis not present

## 2020-01-24 DIAGNOSIS — Z3493 Encounter for supervision of normal pregnancy, unspecified, third trimester: Secondary | ICD-10-CM | POA: Diagnosis not present

## 2020-01-30 ENCOUNTER — Encounter (HOSPITAL_COMMUNITY): Payer: Self-pay | Admitting: Obstetrics and Gynecology

## 2020-01-30 ENCOUNTER — Other Ambulatory Visit: Payer: Self-pay

## 2020-01-30 ENCOUNTER — Inpatient Hospital Stay (HOSPITAL_COMMUNITY)
Admission: AD | Admit: 2020-01-30 | Discharge: 2020-02-01 | DRG: 805 | Disposition: A | Discharge status: Home / Self Care | Payer: Medicaid Other | Attending: Obstetrics and Gynecology | Admitting: Obstetrics and Gynecology

## 2020-01-30 DIAGNOSIS — O26893 Other specified pregnancy related conditions, third trimester: Secondary | ICD-10-CM | POA: Diagnosis not present

## 2020-01-30 DIAGNOSIS — Z20822 Contact with and (suspected) exposure to covid-19: Secondary | ICD-10-CM | POA: Diagnosis not present

## 2020-01-30 DIAGNOSIS — O42013 Preterm premature rupture of membranes, onset of labor within 24 hours of rupture, third trimester: Secondary | ICD-10-CM | POA: Diagnosis not present

## 2020-01-30 DIAGNOSIS — Z3A33 33 weeks gestation of pregnancy: Secondary | ICD-10-CM

## 2020-01-30 DIAGNOSIS — F17210 Nicotine dependence, cigarettes, uncomplicated: Secondary | ICD-10-CM | POA: Diagnosis present

## 2020-01-30 DIAGNOSIS — O42913 Preterm premature rupture of membranes, unspecified as to length of time between rupture and onset of labor, third trimester: Principal | ICD-10-CM | POA: Diagnosis present

## 2020-01-30 DIAGNOSIS — E119 Type 2 diabetes mellitus without complications: Secondary | ICD-10-CM | POA: Diagnosis not present

## 2020-01-30 DIAGNOSIS — Z794 Long term (current) use of insulin: Secondary | ICD-10-CM | POA: Diagnosis not present

## 2020-01-30 DIAGNOSIS — O99334 Smoking (tobacco) complicating childbirth: Secondary | ICD-10-CM | POA: Diagnosis not present

## 2020-01-30 DIAGNOSIS — O2412 Pre-existing diabetes mellitus, type 2, in childbirth: Secondary | ICD-10-CM | POA: Diagnosis not present

## 2020-01-30 DIAGNOSIS — O42919 Preterm premature rupture of membranes, unspecified as to length of time between rupture and onset of labor, unspecified trimester: Secondary | ICD-10-CM

## 2020-01-30 LAB — GLUCOSE, CAPILLARY
Glucose-Capillary: 231 mg/dL — ABNORMAL HIGH (ref 70–99)
Glucose-Capillary: 179 mg/dL — ABNORMAL HIGH (ref 70–99)
Glucose-Capillary: 149 mg/dL — ABNORMAL HIGH (ref 70–99)

## 2020-01-30 LAB — HEMOGLOBIN A1C
Hgb A1c MFr Bld: 5.6 % (ref 4.8–5.6)
Mean Plasma Glucose: 114.02 mg/dL

## 2020-01-30 LAB — CBC
HCT: 38 % (ref 36.0–46.0)
Hemoglobin: 12.3 g/dL (ref 12.0–15.0)
MCH: 27.2 pg (ref 26.0–34.0)
MCHC: 32.4 g/dL (ref 30.0–36.0)
MCV: 84.1 fL (ref 80.0–100.0)
Platelets: 264 10*3/uL (ref 150–400)
RBC: 4.52 MIL/uL (ref 3.87–5.11)
RDW: 13.8 % (ref 11.5–15.5)
WBC: 19.6 10*3/uL — ABNORMAL HIGH (ref 4.0–10.5)
nRBC: 0 % (ref 0.0–0.2)

## 2020-01-30 LAB — WET PREP, GENITAL
Clue Cells Wet Prep HPF POC: NONE SEEN
Sperm: NONE SEEN
Trich, Wet Prep: NONE SEEN
Yeast Wet Prep HPF POC: NONE SEEN

## 2020-01-30 LAB — TYPE AND SCREEN
ABO/RH(D): A POS
Antibody Screen: NEGATIVE

## 2020-01-30 LAB — SARS CORONAVIRUS 2 BY RT PCR (HOSPITAL ORDER, PERFORMED IN ~~LOC~~ HOSPITAL LAB): SARS Coronavirus 2: NEGATIVE

## 2020-01-30 LAB — POCT FERN TEST: POCT Fern Test: POSITIVE

## 2020-01-30 MED ORDER — LACTATED RINGERS IV SOLN: INTRAVENOUS | Status: DC

## 2020-01-30 MED ORDER — ZOLPIDEM TARTRATE 5 MG PO TABS: 5.0000 mg | ORAL_TABLET | Freq: Every evening | ORAL | Status: DC | PRN

## 2020-01-30 MED ORDER — AMOXICILLIN 500 MG PO CAPS: 500.0000 mg | ORAL_CAPSULE | Freq: Three times a day (TID) | ORAL | Status: DC

## 2020-01-30 MED ORDER — DOCUSATE SODIUM 100 MG PO CAPS
100.0000 mg | ORAL_CAPSULE | Freq: Every day | ORAL | Status: DC
  Administered 2020-01-31 – 2020-02-01 (×2): 100 mg via ORAL
  Filled 2020-01-30 (×2): qty 1

## 2020-01-30 MED ORDER — MAGNESIUM SULFATE 40 GM/1000ML IV SOLN: 2.0000 g/h | INTRAVENOUS | Status: DC

## 2020-01-30 MED ORDER — LACTATED RINGERS IV SOLN: 500.0000 mL | INTRAVENOUS | Status: DC | PRN

## 2020-01-30 MED ORDER — MAGNESIUM SULFATE BOLUS VIA INFUSION
6.0000 g | Freq: Once | INTRAVENOUS | Status: DC
  Filled 2020-01-30: qty 1000

## 2020-01-30 MED ORDER — BETAMETHASONE SOD PHOS & ACET 6 (3-3) MG/ML IJ SUSP
12.0000 mg | INTRAMUSCULAR | Status: DC
  Administered 2020-01-30: 12 mg via INTRAMUSCULAR
  Filled 2020-01-30: qty 5

## 2020-01-30 MED ORDER — INSULIN NPH (HUMAN) (ISOPHANE) 100 UNIT/ML ~~LOC~~ SUSP
20.0000 [IU] | Freq: Every day | SUBCUTANEOUS | Status: DC
  Filled 2020-01-30: qty 10

## 2020-01-30 MED ORDER — OXYCODONE-ACETAMINOPHEN 5-325 MG PO TABS: 1.0000 | ORAL_TABLET | ORAL | Status: DC | PRN

## 2020-01-30 MED ORDER — ACETAMINOPHEN 325 MG PO TABS: 650.0000 mg | ORAL_TABLET | ORAL | Status: DC | PRN

## 2020-01-30 MED ORDER — SODIUM CHLORIDE 0.9 % IV SOLN: 250.0000 mL | INTRAVENOUS | Status: DC | PRN

## 2020-01-30 MED ORDER — FENTANYL CITRATE (PF) 100 MCG/2ML IJ SOLN
50.0000 ug | INTRAMUSCULAR | Status: DC | PRN
  Administered 2020-01-30: 50 ug via INTRAVENOUS
  Filled 2020-01-30: qty 2

## 2020-01-30 MED ORDER — SODIUM CHLORIDE 0.9 % IV SOLN
2.0000 g | Freq: Four times a day (QID) | INTRAVENOUS | Status: DC
  Administered 2020-01-30 (×2): 2 g via INTRAVENOUS
  Filled 2020-01-30 (×2): qty 2000

## 2020-01-30 MED ORDER — OXYTOCIN BOLUS FROM INFUSION
333.0000 mL | Freq: Once | INTRAVENOUS | Status: AC
  Administered 2020-01-30: 333 mL via INTRAVENOUS

## 2020-01-30 MED ORDER — OXYTOCIN-SODIUM CHLORIDE 30-0.9 UT/500ML-% IV SOLN
2.5000 [IU]/h | INTRAVENOUS | Status: DC
  Filled 2020-01-30: qty 500

## 2020-01-30 MED ORDER — SOD CITRATE-CITRIC ACID 500-334 MG/5ML PO SOLN: 30.0000 mL | ORAL | Status: DC | PRN

## 2020-01-30 MED ORDER — AZITHROMYCIN 250 MG PO TABS
1000.0000 mg | ORAL_TABLET | Freq: Once | ORAL | Status: AC
  Administered 2020-01-30: 1000 mg via ORAL
  Filled 2020-01-30: qty 4

## 2020-01-30 MED ORDER — METHYLERGONOVINE MALEATE 0.2 MG/ML IJ SOLN
INTRAMUSCULAR | Status: AC
  Administered 2020-01-30: 0.2 mg via INTRAMUSCULAR
  Filled 2020-01-30: qty 1

## 2020-01-30 MED ORDER — SODIUM CHLORIDE 0.9% FLUSH: 3.0000 mL | INTRAVENOUS | Status: DC | PRN

## 2020-01-30 MED ORDER — ONDANSETRON HCL 4 MG/2ML IJ SOLN: 4.0000 mg | Freq: Four times a day (QID) | INTRAMUSCULAR | Status: DC | PRN

## 2020-01-30 MED ORDER — INSULIN ASPART 100 UNIT/ML ~~LOC~~ SOLN
14.0000 [IU] | Freq: Three times a day (TID) | SUBCUTANEOUS | Status: DC
  Administered 2020-01-30: 14 [IU] via SUBCUTANEOUS

## 2020-01-30 MED ORDER — INSULIN NPH (HUMAN) (ISOPHANE) 100 UNIT/ML ~~LOC~~ SUSP
24.0000 [IU] | Freq: Every day | SUBCUTANEOUS | Status: DC
  Administered 2020-01-30: 12 [IU] via SUBCUTANEOUS
  Filled 2020-01-30: qty 10

## 2020-01-30 MED ORDER — SODIUM CHLORIDE 0.9% FLUSH
3.0000 mL | Freq: Two times a day (BID) | INTRAVENOUS | Status: DC
  Administered 2020-01-31 (×2): 3 mL via INTRAVENOUS

## 2020-01-30 MED ORDER — OXYCODONE-ACETAMINOPHEN 5-325 MG PO TABS: 2.0000 | ORAL_TABLET | ORAL | Status: DC | PRN

## 2020-01-30 MED ORDER — LIDOCAINE HCL (PF) 1 % IJ SOLN
30.0000 mL | INTRAMUSCULAR | Status: AC | PRN
  Administered 2020-01-30: 30 mL via SUBCUTANEOUS
  Filled 2020-01-30: qty 30

## 2020-01-30 MED ORDER — METHYLERGONOVINE MALEATE 0.2 MG/ML IJ SOLN: 0.2000 mg | Freq: Once | INTRAMUSCULAR | Status: AC

## 2020-01-30 MED ORDER — PRENATAL MULTIVITAMIN CH
1.0000 | ORAL_TABLET | Freq: Every day | ORAL | Status: DC
  Administered 2020-01-30 – 2020-01-31 (×2): 1 via ORAL
  Filled 2020-01-30 (×2): qty 1

## 2020-01-30 MED ORDER — NICOTINE 21 MG/24HR TD PT24
21.0000 mg | MEDICATED_PATCH | Freq: Every day | TRANSDERMAL | Status: DC
  Administered 2020-01-30 – 2020-02-01 (×2): 21 mg via TRANSDERMAL
  Filled 2020-01-30 (×3): qty 1

## 2020-01-30 MED ORDER — CALCIUM CARBONATE ANTACID 500 MG PO CHEW: 2.0000 | CHEWABLE_TABLET | ORAL | Status: DC | PRN

## 2020-01-30 NOTE — Progress Notes (Signed)
Patient ID: Mindy Bradford, female   DOB: 28-Jun-1985, 34 y.o.   MRN: [id number]  CTSP: Re worsening painful contractions Pt has started feeling more painful contractions over the last few hours. Cervix not initially checked on admission due to no contraction activity. Now on exam patient painfully contracting and 4/90/-1. Variable decelerations noted with most contractions. Given concern for active labor and variable decelerations will transfer to L&D for possibility of more imminent delivery. Will start magnesium to attempt to tocolisis

## 2020-01-30 NOTE — Progress Notes (Signed)
Pt ate lunch at 1515- 2 hours postprandial CBG 231.   Pt states she ate a hamburger within the hour. MD notified. Will recheck CBG in 2 hours per verbal order from Dr. Tenny Craw.

## 2020-01-30 NOTE — Consult Note (Signed)
[facility]  Prenatal Consult       01/30/2020  9:40 PM   I was asked by Dr. Tenny Craw to consult on this patient for possible preterm delivery. I had the pleasure of meeting with Ms. Sangster and her partner today. She is a 34 year old G3P2002 woman at [redacted]w[redacted]d with premature rupture of membranes. Pregnancy has been complicated by insulin-dependent diabetes. She has progressed to active labor. She has received one dose of betamethasone. She is expecting a baby boy, named Mindy Bradford.  I explained that the neonatal intensive care team would be present for the delivery and outlined the likely delivery room course for this baby including routine resuscitation and NRP-guided approaches to the treatment of respiratory distress. We discussed other common problems associated with prematurity including respiratory distress syndrome, apnea, feeding issues, temperature regulation, and infection risk.   We discussed the average length of stay but I noted that the actual LOS would depend on the severity of problems encountered and response to treatments. We discussed visitation policies and the resources available while her child is in the hospital.  We discussed the importance of good nutrition and various methods of providing nutrition (parenteral hyperalimentation, gavage feedings and/or oral feeding). We discussed the benefits of human milk and Ms. Cumberland does plan to breast feed. I encouraged pumping soon after birth and outlined resources that are available to support breast feeding. We discussed the possibility of using donor breast milk as a bridge, and she is amenable to this.  Thank you for involving [place] in the care of this patient. A member of our team will be available should the family have additional questions.  Time for consultation approximately 20 minutes.  Jacob Moores, MD Neonatal Medicine

## 2020-01-30 NOTE — H&P (Signed)
Mindy Bradford is a 34 y.o. female presenting for leaking fluid  34 year old G3 P2-0-0-2 at 33+0 presents to maternity admissions for complaint of leaking fluid since 2 AM.  Speculum exam confirms pool positive.  The patient's pregnancy has been complicated by gestational diabetes requiring insulin.  Patient is likely type II diabetic as her hemoglobin A1c at her first prenatal visit at 9 weeks was 6.7.  She failed an early 1 hour Glucola with glucose of 257.  She was started on insulin therapy at 16 weeks  Current insulin regimen  - NPH insulin 20 units every morning - NPH insulin 24 units nightly - NovoLog insulin 14 units with each meal  She had a fetal echo performed at 6/8 normal  Bedside ultrasound shows vertex presentation. 01/01/20 [place] for EFW 1400gm (3#) 60%, AFI 16cm OB History    Gravida  3   Para  2   Term  2   Preterm      AB      Living  2     SAB      TAB      Ectopic      Multiple      Live Births  2          Past Medical History:  Diagnosis Date  . 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 CHOLECYSTECTOMY  2008  . LEEP  5 years ago   normal results  . WISDOM TOOTH EXTRACTION     age 1   Family History: family history includes ADD / ADHD in her brother and sister; Asthma in her mother and sister; Cancer in an other family member; Diabetes in an other family member; Heart disease in an other family member; Hyperlipidemia in an other family member; Hypertension in an other family member. Social History:  reports that she has been smoking cigarettes. She has a 2.00 pack-year smoking history. She has never used smokeless tobacco. She reports that she does not drink alcohol and does not use drugs.     Maternal Diabetes: Yes:  Diabetes Type:  Insulin/Medication controlled Genetic Screening: Normal Maternal Ultrasounds/Referrals: Normal Fetal Ultrasounds or other  Referrals:  Fetal echo Maternal Substance Abuse:  Yes:  Type: Smoker Significant Maternal Medications:  Meds include: Other:  insulin Significant Maternal Lab Results:  None Other Comments:  None  Review of Systems History   Blood pressure 120/66, pulse (!) 112, temperature 98.5 F (36.9 C), temperature source Oral, resp. rate 20, height 5\' 7"  (1.702 m), weight 66 kg, SpO2 97 %. Exam Physical Exam  Prenatal labs: ABO, Rh:  A pos Antibody:  Neg Rubella:  Imm RPR:   NR HBsAg:   Neg HIV:   NR GBS:   pending  Assessment/Plan: 1) admit to antepartum 2) antibiotics for latency 3) betamethasone q. 24 hours x 2 doses for fetal lung maturity  4) bedrest with bathroom privileges 5) NICU consultation given anticipation of preterm delivery 6) SCDs for DVT prophylaxis 7) insulin regimen as noted above.  Anticipate patient will require an increase in her insulin given administration of steroids.  Will cover with sliding scale as needed  8) patient was scheduled for ultrasound for growth in office tomorrow.  Will perform as inpatient.  01/30/2020, 1:41 PM

## 2020-01-30 NOTE — MAU Provider Note (Signed)
Chief Complaint:  Rupture of Membranes   First Provider Initiated Contact with Patient 01/30/20 1240     HPI: Mindy Bradford is a 34 y.o. G3P2002 at [redacted]w[redacted]d who presents to maternity admissions reporting leaking of fluid. Leaking since 2 am this morning; clear fluid. Denies contractions or vaginal bleeding. Good fetal movement.   Pregnancy Course: [facility]. GDM on insulin.   Past Medical History:  Diagnosis Date  . 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 CHOLECYSTECTOMY  2008  . LEEP  5 years ago   normal results  . WISDOM TOOTH EXTRACTION     age 82   Family History  Problem Relation Age of Onset  . Asthma Mother   . Asthma Sister   . ADD / ADHD Sister   . ADD / ADHD Brother   . Heart disease Other   . Cancer Other   . Diabetes Other   . Hyperlipidemia Other   . Hypertension Other    Social History   Tobacco Use  . Smoking status: Current Every Day Smoker    Packs/day: 0.25    Years: 8.00    Pack years: 2.00    Types: Cigarettes  . Smokeless tobacco: Never Used  Substance Use Topics  . Alcohol use: No  . Drug use: No   Allergies  Allergen Reactions  . Sulfa Antibiotics Rash   Medications Prior to Admission  Medication Sig Dispense Refill Last Dose  . aspirin-acetaminophen-caffeine (EXCEDRIN MIGRAINE) 250-250-65 MG tablet Take 1 tablet by mouth every 6 (six) hours as needed for headache.   Past Month at Unknown time  . insulin aspart protamine- aspart (NOVOLOG MIX 70/30) (70-30) 100 UNIT/ML injection Inject 14 Units into the skin in the morning, at  noon, and at bedtime.    01/30/2020 at Unknown time  . insulin NPH Human (HUMULIN N) 100 UNIT/ML injection Inject 20-24 Units into the skin See admin instructions. 20units in AM before breakfast and 24units QHS   01/30/2020 at Unknown time  . ondansetron (ZOFRAN ODT) 8 MG disintegrating tablet Take 1 tablet (8 mg total) by mouth every 8 (eight) hours as needed for nausea or vomiting. 10 tablet 0     I have reviewed patient's Past Medical Hx, Surgical Hx, Family Hx, Social Hx, medications and allergies.   ROS:  Review of Systems  Constitutional: Negative.   Gastrointestinal: Negative.   Genitourinary: Positive for vaginal discharge. Negative for vaginal bleeding.    Physical Exam   Patient Vitals for the past 24 hrs:  BP Temp Temp src Pulse Resp SpO2 Height Weight  01/30/20 1147 120/66 98.5 F (36.9 C) Oral (!) 112 20 97 % -- --  01/30/20 1143 -- -- -- -- -- -- 5\' 7"  (1.702 m) 66 kg    Constitutional: Well-developed, well-nourished female in no acute distress.  Cardiovascular: normal rate & rhythm, no murmur  Respiratory: normal effort, lung sounds clear throughout GI: Abd soft, non-tender, gravid appropriate for gestational age. Pos BS x 4 MS: Extremities nontender, no edema, normal ROM Neurologic: Alert and oriented x 4.  GU:  SSE: moderate amount of clear fluid pooling. Cervix visually closed. Digital exam deferred.     Fetal Tracing:  Baseline: 145 Variability: moderate  Accelerations: 10x10 Decelerations: variable  Toco: none    Labs: Results for orders placed or performed during the hospital encounter of 01/30/20 (from the past 24 hour(s))  POCT fern test     Status: Abnormal   Collection Time: 01/30/20 12:57 PM  Result Value Ref Range   POCT Fern Test Positive = ruptured amniotic membanes     Imaging:  No results found.  MAU Course: Orders Placed This Encounter  Procedures  . Wet prep, genital  . Culture, beta strep (group b only)  . SARS Coronavirus 2 by  RT PCR (hospital order, performed in [facility] hospital lab) Nasopharyngeal Nasopharyngeal Swab  . Fern Test  . POCT fern test   No orders of the defined types were placed in this encounter.   MDM: Leaking since early this morning. Sterile spec exam performed; moderate amount of clear fluid pooling. Vaginal swabs collected. Digital exam deferred but visually cervix is closed.   BSUS performed for presentation Pt informed that the ultrasound is considered a limited OB ultrasound and is not intended to be a complete ultrasound exam.  Patient also informed that the ultrasound is not being completed with the intent of assessing for fetal or placental anomalies or any pelvic abnormalities.  Explained that the purpose of today's ultrasound is to assess for  presentation.  Patient acknowledges the purpose of the exam and the limitations of the study.  cephalic  Dr. Tenny Craw notified.   Assessment: 1. Preterm premature rupture of membranes (PPROM) with unknown onset of labor   2. [redacted] weeks gestation of pregnancy     Plan: Admit to [facility] unit Dr. Tenny Craw to place admission orders   Judeth Horn, NP 01/30/2020 1:01 PM

## 2020-01-30 NOTE — MAU Note (Signed)
Presents with c/o LOF since 0200 this morning, reports fluid clear.  Denies VB.  Reports FM, but only twice today.  States hasn't eaten today.

## 2020-01-31 LAB — CBC
HCT: 34.3 % — ABNORMAL LOW (ref 36.0–46.0)
HCT: 36.4 % (ref 36.0–46.0)
Hemoglobin: 11.2 g/dL — ABNORMAL LOW (ref 12.0–15.0)
Hemoglobin: 12 g/dL (ref 12.0–15.0)
MCH: 27.2 pg (ref 26.0–34.0)
MCH: 27.4 pg (ref 26.0–34.0)
MCHC: 32.7 g/dL (ref 30.0–36.0)
MCHC: 33 g/dL (ref 30.0–36.0)
MCV: 83.1 fL (ref 80.0–100.0)
MCV: 83.3 fL (ref 80.0–100.0)
Platelets: 273 10*3/uL (ref 150–400)
Platelets: 288 10*3/uL (ref 150–400)
RBC: 4.12 MIL/uL (ref 3.87–5.11)
RBC: 4.38 MIL/uL (ref 3.87–5.11)
RDW: 13.7 % (ref 11.5–15.5)
RDW: 13.8 % (ref 11.5–15.5)
WBC: 29.1 10*3/uL — ABNORMAL HIGH (ref 4.0–10.5)
WBC: 32.1 10*3/uL — ABNORMAL HIGH (ref 4.0–10.5)
nRBC: 0 % (ref 0.0–0.2)
nRBC: 0 % (ref 0.0–0.2)

## 2020-01-31 LAB — GC/CHLAMYDIA PROBE AMP (~~LOC~~) NOT AT ARMC
Chlamydia: NEGATIVE
Comment: NEGATIVE
Comment: NORMAL
Neisseria Gonorrhea: NEGATIVE

## 2020-01-31 LAB — GLUCOSE, CAPILLARY
Glucose-Capillary: 190 mg/dL — ABNORMAL HIGH (ref 70–99)
Glucose-Capillary: 112 mg/dL — ABNORMAL HIGH (ref 70–99)
Glucose-Capillary: 115 mg/dL — ABNORMAL HIGH (ref 70–99)
Glucose-Capillary: 162 mg/dL — ABNORMAL HIGH (ref 70–99)
Glucose-Capillary: 113 mg/dL — ABNORMAL HIGH (ref 70–99)

## 2020-01-31 LAB — RPR: RPR Ser Ql: NONREACTIVE

## 2020-01-31 MED ORDER — ZOLPIDEM TARTRATE 5 MG PO TABS: 5.0000 mg | ORAL_TABLET | Freq: Every evening | ORAL | Status: DC | PRN

## 2020-01-31 MED ORDER — BENZOCAINE-MENTHOL 20-0.5 % EX AERO: 1.0000 "application " | INHALATION_SPRAY | CUTANEOUS | Status: DC | PRN

## 2020-01-31 MED ORDER — TETANUS-DIPHTH-ACELL PERTUSSIS 5-2.5-18.5 LF-MCG/0.5 IM SUSP: 0.5000 mL | Freq: Once | INTRAMUSCULAR | Status: DC

## 2020-01-31 MED ORDER — SIMETHICONE 80 MG PO CHEW: 80.0000 mg | CHEWABLE_TABLET | ORAL | Status: DC | PRN

## 2020-01-31 MED ORDER — SENNOSIDES-DOCUSATE SODIUM 8.6-50 MG PO TABS
2.0000 | ORAL_TABLET | ORAL | Status: DC
  Administered 2020-01-31 – 2020-02-01 (×2): 2 via ORAL
  Filled 2020-01-31 (×2): qty 2

## 2020-01-31 MED ORDER — DIBUCAINE (PERIANAL) 1 % EX OINT: 1.0000 "application " | TOPICAL_OINTMENT | CUTANEOUS | Status: DC | PRN

## 2020-01-31 MED ORDER — WITCH HAZEL-GLYCERIN EX PADS: 1.0000 "application " | MEDICATED_PAD | CUTANEOUS | Status: DC | PRN

## 2020-01-31 MED ORDER — ACETAMINOPHEN 325 MG PO TABS: 650.0000 mg | ORAL_TABLET | ORAL | Status: DC | PRN

## 2020-01-31 MED ORDER — ONDANSETRON HCL 4 MG/2ML IJ SOLN: 4.0000 mg | INTRAMUSCULAR | Status: DC | PRN

## 2020-01-31 MED ORDER — INSULIN NPH (HUMAN) (ISOPHANE) 100 UNIT/ML ~~LOC~~ SUSP
12.0000 [IU] | Freq: Every day | SUBCUTANEOUS | Status: DC
  Administered 2020-02-01: 12 [IU] via SUBCUTANEOUS
  Filled 2020-01-31 (×2): qty 10

## 2020-01-31 MED ORDER — COCONUT OIL OIL: 1.0000 "application " | TOPICAL_OIL | Status: DC | PRN

## 2020-01-31 MED ORDER — METHYLERGONOVINE MALEATE 0.2 MG PO TABS: 0.2000 mg | ORAL_TABLET | ORAL | Status: DC | PRN

## 2020-01-31 MED ORDER — INSULIN NPH (HUMAN) (ISOPHANE) 100 UNIT/ML ~~LOC~~ SUSP
10.0000 [IU] | Freq: Every day | SUBCUTANEOUS | Status: DC
  Administered 2020-01-31 – 2020-02-01 (×2): 10 [IU] via SUBCUTANEOUS
  Filled 2020-01-31: qty 10

## 2020-01-31 MED ORDER — METHYLERGONOVINE MALEATE 0.2 MG/ML IJ SOLN: 0.2000 mg | INTRAMUSCULAR | Status: DC | PRN

## 2020-01-31 MED ORDER — IBUPROFEN 600 MG PO TABS
600.0000 mg | ORAL_TABLET | Freq: Four times a day (QID) | ORAL | Status: DC
  Administered 2020-01-31 – 2020-02-01 (×6): 600 mg via ORAL
  Filled 2020-01-31 (×6): qty 1

## 2020-01-31 MED ORDER — INSULIN ASPART 100 UNIT/ML ~~LOC~~ SOLN
6.0000 [IU] | Freq: Three times a day (TID) | SUBCUTANEOUS | Status: DC
  Administered 2020-01-31 – 2020-02-01 (×3): 6 [IU] via SUBCUTANEOUS

## 2020-01-31 MED ORDER — OXYCODONE HCL 5 MG PO TABS: 10.0000 mg | ORAL_TABLET | ORAL | Status: DC | PRN

## 2020-01-31 MED ORDER — ONDANSETRON HCL 4 MG PO TABS: 4.0000 mg | ORAL_TABLET | ORAL | Status: DC | PRN

## 2020-01-31 MED ORDER — OXYCODONE HCL 5 MG PO TABS: 5.0000 mg | ORAL_TABLET | ORAL | Status: DC | PRN

## 2020-01-31 MED ORDER — DIPHENHYDRAMINE HCL 25 MG PO CAPS: 25.0000 mg | ORAL_CAPSULE | Freq: Four times a day (QID) | ORAL | Status: DC | PRN

## 2020-01-31 MED ORDER — PRENATAL MULTIVITAMIN CH: 1.0000 | ORAL_TABLET | Freq: Every day | ORAL | Status: DC

## 2020-01-31 NOTE — Lactation Note (Signed)
This note was copied from a baby's chart. Lactation Consultation Note  Patient Name: Mindy Bradford TZGYF'V Date: 01/31/2020 Reason for consult: Initial assessment;NICU baby;Preterm <34wks   Mother is a P3, she has a 33 week infant in the NICU.  Mother reports that she breastfed two other children for 2-3 months each. Mother reports that she knows how to hand express. She prefer not to have assistance with hand expression.   A DEBP was sat up for Mother with #24 flanges. Mother reports that the flanges felt comfortable. Observed that they looked a little tight. Suggested that she use the #27. Observed tiny drops of colostrum on the tips of her nipples. Mother advised to pump every  2-3 hours for 15 mins.   Discussed collection, storage and cleaning of pump parts. Mother spent most of the day with her infant in the NICU.  Mother was given Providing Breastmilk for your NICU Baby and the [facility] brochure. Mother is active with [place] [employer]IC . A WIC referral was sent for mohter to get a pump.    Maternal Data Has patient been taught Hand Expression?: Yes (mother reports that she know how to hand express) Does the patient have breastfeeding experience prior to this delivery?: Yes  Feeding Feeding Type: Formula  LATCH Score                   Interventions Interventions: Hand pump;DEBP  Lactation Tools Discussed/Used WIC Program: Yes Pump Review: Setup, frequency, and cleaning;Milk Storage Initiated by:: Stevan Born RN,IBCLC Date initiated:: 01/31/20   Consult Status Consult Status: Follow-up Date: 01/31/20 Follow-up type: In-patient    Stevan Born [facility] 01/31/2020, 3:23 PM

## 2020-01-31 NOTE — Progress Notes (Signed)
Patient is doing well.  She is ambulating, voiding, tolerating PO.  Pain control is good.  Lochia is appropriate  Vitals:   01/30/20 2331 01/30/20 2346 01/31/20 0140 01/31/20 0437  BP: (!) 107/59 116/62 (!) 118/57 (!) 103/56  Pulse: (!) 105 95 89 85  Resp: 17 17  18   Temp:   97.8 F (36.6 C) 98 F (36.7 C)  TempSrc:   Oral Oral  SpO2:   95% 97%  Weight:      Height:        NAD Fundus firm Ext: no edema  Lab Results  Component Value Date   WBC 29.1 (H) 01/31/2020   HGB 11.2 (L) 01/31/2020   HCT 34.3 (L) 01/31/2020   MCV 83.3 01/31/2020   PLT 288 01/31/2020    --/--/A POS (08/25 1303)  A/P 33 y.o. 06-04-1981 PPD#1 s/p TSVD. DM2--insulin decreased to NPH 10/12, Novolog 6U w meals.  Given dose of BMZ <24 hr prior to delivery, suspect BG will continue to be elevated.  Will monitor in PP period and decrease insulin as able Baby doing well in NICU  [facility] GEFFEL Idanha

## 2020-01-31 NOTE — Progress Notes (Signed)
Patient screened out for psychosocial assessment since none of the following apply:  Psychosocial stressors documented in mother or baby's chart  Gestation less than 32 weeks  Code at delivery   Infant with anomalies Please contact the Clinical Social Worker if specific needs arise, by MOB's request, or if MOB scores greater than 9/yes to question 10 on Edinburgh Postpartum Depression Screen.  Ansh Fauble, LCSW Clinical Social Worker [facility] Cell#: [phone]     

## 2020-02-01 LAB — CULTURE, BETA STREP (GROUP B ONLY)

## 2020-02-01 LAB — GLUCOSE, CAPILLARY: Glucose-Capillary: 100 mg/dL — ABNORMAL HIGH (ref 70–99)

## 2020-02-01 MED ORDER — INSULIN NPH (HUMAN) (ISOPHANE) 100 UNIT/ML ~~LOC~~ SUSP: 12.0000 [IU] | Freq: Every day | SUBCUTANEOUS | 11 refills | Status: DC

## 2020-02-01 MED ORDER — INSULIN NPH (HUMAN) (ISOPHANE) 100 UNIT/ML ~~LOC~~ SUSP: 10.0000 [IU] | Freq: Every day | SUBCUTANEOUS | 11 refills | Status: DC

## 2020-02-01 MED ORDER — INSULIN ASPART 100 UNIT/ML ~~LOC~~ SOLN: 6.0000 [IU] | Freq: Three times a day (TID) | SUBCUTANEOUS | 11 refills | Status: DC

## 2020-02-01 MED ORDER — OXYCODONE HCL 5 MG PO TABS
5.0000 mg | ORAL_TABLET | ORAL | 0 refills | Status: DC | PRN
Start: 2020-02-01 — End: 2020-12-05

## 2020-02-01 NOTE — Lactation Note (Signed)
This note was copied from a baby's chart. Lactation Consultation Note  Patient Name: Mindy Bradford FVCBS'W Date: 02/01/2020   Lactation visit attempted in Mom's postpartum room, but Mom was not there. I left a note for Mom to call me when she returns. The nurse tech also brought in dishwashing liquid & a clean toothbrush to wash pump parts. Mom appears to be using size 27 flanges (see last LC's note).   Lurline Hare [facility] 02/01/2020, 9:10 AM

## 2020-02-01 NOTE — Discharge Summary (Signed)
Postpartum Discharge Summary     Patient Name: Mindy Bradford DOB: 31-Jan-1986 MRN: [id number]  Date of admission: 01/30/2020 Delivery date:01/30/2020  Delivering provider: Vanessa Kick  Date of discharge: 02/01/2020  Admitting diagnosis: Preterm premature rupture of membranes in third trimester [O42.913] Preterm delivery without spontaneous labor [O60.10X0] Intrauterine pregnancy: [redacted]w[redacted]d     Secondary diagnosis:  Active Problems:   Preterm premature rupture of membranes in third trimester   Preterm delivery without spontaneous labor  Additional problems: GDMA2 (likely pre-DM dx in pregnancy)     Discharge diagnosis: Preterm Pregnancy Delivered and GDM A2                                              Post partum procedures:none Augmentation: N/A Complications: None  Hospital course: Onset of Labor With Vaginal Delivery      34 y.o. yo [id number] at [redacted]w[redacted]d was admitted in Latent Labor on 01/30/2020. Patient had an uncomplicated labor course as follows:  Membrane Rupture Time/Date: 2:00 AM ,01/30/2020   Delivery Method:Vaginal, Spontaneous  Episiotomy: None  Lacerations:  2nd degree;Perineal  Patient had an uncomplicated postpartum course.  She is ambulating, tolerating a regular diet, passing flatus, and urinating well. Patient is discharged home in stable condition on 02/01/20.  Newborn Data: Birth date:01/30/2020  Birth time:10:25 PM  Gender:Female  Living status:Living  Apgars:7 ,8  Weight:2230 g   Magnesium Sulfate received: No BMZ received: Yes Rhophylac:N/A MMR:N/A T-DaP:see office note Flu: N/A Transfusion:No  Physical exam  Vitals:   01/31/20 1713 01/31/20 1742 01/31/20 2206 02/01/20 0604  BP: 122/68 119/72 130/74 128/76  Pulse: 99 98 (!) 101 99  Resp: $Remo'18 18 18 17  'hAAnu$ Temp: 98.1 F (36.7 C) 97.9 F (36.6 C) 98 F (36.7 C) 97.8 F (36.6 C)  TempSrc: Oral Oral Oral Oral  SpO2: 99% 99% 98% 99%  Weight:      Height:       General: alert, cooperative and no  distress Lochia: appropriate Uterine Fundus: firm DVT Evaluation: No evidence of DVT seen on physical exam. Labs: Lab Results  Component Value Date   WBC 29.1 (H) 01/31/2020   HGB 11.2 (L) 01/31/2020   HCT 34.3 (L) 01/31/2020   MCV 83.3 01/31/2020   PLT 288 01/31/2020   CMP Latest Ref Rng & Units 04/22/2016  Glucose 65 - 99 mg/dL 118(H)  BUN 6 - 20 mg/dL 8  Creatinine 0.44 - 1.00 mg/dL 0.63  Sodium 135 - 145 mmol/L 137  Potassium 3.5 - 5.1 mmol/L 4.1  Chloride 101 - 111 mmol/L 105  CO2 22 - 32 mmol/L 25  Calcium 8.9 - 10.3 mg/dL 9.1   Edinburgh Score: Edinburgh Postnatal Depression Scale Screening Tool 01/31/2020  I have been able to laugh and see the funny side of things. 0  I have looked forward with enjoyment to things. 0  I have blamed myself unnecessarily when things went wrong. 0  I have been anxious or worried for no good reason. 0  I have felt scared or panicky for no good reason. 0  Things have been getting on top of me. 0  I have been so unhappy that I have had difficulty sleeping. 0  I have felt sad or miserable. 0  I have been so unhappy that I have been crying. 0  The thought of harming myself has occurred to  me. 0  Edinburgh Postnatal Depression Scale Total 0      After visit meds:  Allergies as of 02/01/2020      Reactions   Sulfa Antibiotics Rash      Medication List    STOP taking these medications   insulin aspart protamine- aspart (70-30) 100 UNIT/ML injection Commonly known as: NOVOLOG MIX 70/30   ondansetron 8 MG disintegrating tablet Commonly known as: Zofran ODT     TAKE these medications   aspirin-acetaminophen-caffeine 250-250-65 MG tablet Commonly known as: EXCEDRIN MIGRAINE Take 1 tablet by mouth every 6 (six) hours as needed for headache.   insulin aspart 100 UNIT/ML injection Commonly known as: novoLOG Inject 6 Units into the skin 3 (three) times daily with meals.   insulin NPH Human 100 UNIT/ML injection Commonly known as:  NOVOLIN N Inject 0.12 mLs (12 Units total) into the skin at bedtime. What changed: You were already taking a medication with the same name, and this prescription was added. Make sure you understand how and when to take each.   insulin NPH Human 100 UNIT/ML injection Commonly known as: NOVOLIN N Inject 0.1 mLs (10 Units total) into the skin daily before breakfast. Start taking on: February 02, 2020 What changed:   how much to take  when to take this  additional instructions   oxyCODONE 5 MG immediate release tablet Commonly known as: Oxy IR/ROXICODONE Take 1 tablet (5 mg total) by mouth every 4 (four) hours as needed (pain scale 4-7).        Discharge home in stable condition Infant Feeding: Breast Infant Disposition:NICU Discharge instruction: per After Visit Summary and Postpartum booklet. Activity: Advance as tolerated. Pelvic rest for 6 weeks.  Diet: routine diet and carb modified diet Anticipated Birth Control: Unsure Postpartum Appointment:1 week, insulin requirement assessment Additional Postpartum F/U: Postpartum Depression checkup and Incision check 1 week Future Appointments:No future appointments. Follow up Visit:  Follow-up Information    Vanessa Kick, MD Follow up in 2 week(s).   Specialty: Obstetrics and Gynecology Contact information: [address] [place] [address] [phone]                   02/01/2020 Allyn Kenner, DO

## 2020-02-04 LAB — SURGICAL PATHOLOGY

## 2020-02-06 DIAGNOSIS — Z419 Encounter for procedure for purposes other than remedying health state, unspecified: Secondary | ICD-10-CM | POA: Diagnosis not present

## 2020-02-08 ENCOUNTER — Ambulatory Visit: Payer: Self-pay

## 2020-02-08 NOTE — Lactation Note (Signed)
This note was copied from a baby's chart. Lactation Consultation Note  Patient Name: Mindy Bradford Date: 02/08/2020 Reason for consult: Follow-up assessment;NICU baby;Infant < 6lbs;Late-preterm 34-36.6wks  LC in to visit with P3 Mom of LPTI in the NICU.  Baby 82 days old and being exclusively gavage fed 24 cal MBM or preterm formula.   RN requested LC to consult with Mom regarding her milk supply and baby starting to nuzzle at the breast with cues.   Mom has been using a hand pump at home as she hasn't been able to obtain her pump from [facility].  Offered a WIC loaner ($30 deposit), but Mom declined saying she didn't have $30.    Mom has been using the [employer] DEBP in baby's room, pumping every 2 hrs during the day.  Mom states she pumps around 11 pm and again at 6 am. Shared with Mom the importance of the late night pumping (2-5am) with regards to supporting a full milk supply.   Mom's supply has decreased after a maximum amount of 30 ml with pumping.  Mom now able to express 15 ml.  Mom states she isn't familiar with breast massage and hand expression.  Offered to demonstrate this to boost her milk supply.  Mom agreeable.    Baby initially handed to Mom swaddled and Mom used cradle hold to latch baby.  Encouraged Mom to have baby STS which Mom was agreeable to.  Baby placed STS on football hold on right breast.  Hand expressed and milk sprayed onto baby.  Baby opened his mouth and LC assisted Mom to bring baby quickly onto breast.  Mom's hands adjusted to the base of her breast.  Baby gave a few sucks and identified a drop in baby's jaw for a minute before he fell asleep.  Any attempt to widen his latch, or adjust him, he started fussing.   Ended session with baby STS lying prone on Mom's chest.  Baby very contented.  Mom made aware of [facility] loaner available to her.  LC will follow-up tomorrow 9/4.    Feeding Feeding Type: Breast Fed  LATCH Score Latch: Repeated attempts needed to  sustain latch, nipple held in mouth throughout feeding, stimulation needed to elicit sucking reflex.  Audible Swallowing: None  Type of Nipple: Everted at rest and after stimulation  Comfort (Breast/Nipple): Soft / non-tender  Hold (Positioning): Assistance needed to correctly position infant at breast and maintain latch.  LATCH Score: 6  Interventions Interventions: Breast feeding basics reviewed;Assisted with latch;Skin to skin;Breast massage;Hand express;Breast compression;Adjust position;Support pillows;Position options;Expressed milk;DEBP;Hand pump  Lactation Tools Discussed/Used Tools: Pump Breast pump type: Double-Electric Breast Pump   Consult Status Consult Status: Follow-up Date: 02/09/20 Follow-up type: In-patient    [name] 02/08/2020, 2:31 PM

## 2020-02-11 ENCOUNTER — Ambulatory Visit: Payer: Self-pay

## 2020-02-11 NOTE — Lactation Note (Signed)
This note was copied from a baby's chart. Lactation Consultation Note  Patient Name: Boy Starlynn Klinkner ZCHYI'F Date: 02/11/2020 Reason for consult: Follow-up assessment;NICU baby;Preterm <34wks  1440 - 1450 - Lactation followed up with Ms. Attwood; baby was fed at 1400, and I was unable to assist with feeding. Ms. Haddox plans to start the 72 hour breast feeding window on 9/7 at 2000. She plans to arrive at the hospital tomorrow between 1700 and 2000, and she will room in during this period. I scheduled a follow up lactation appointment for 7/8 at the 0800 or 1100 feeding.  In the meantime, we reviewed best practices in pumping. Ms. Crumm is aware that it would be optimal to increase her pumping frequency to 8 times a day. She plans to pump every three hours and will try to stay on baby's schedule. We discussed the benefits that consistent pumping can have on her milk volume.  All questions answered at this time.   Feeding Feeding Type: Breast Milk  Interventions Interventions: Breast feeding basics reviewed;DEBP  Lactation Tools Discussed/Used Pump Review: Setup, frequency, and cleaning   Consult Status Consult Status: Follow-up Date: 02/13/20 Follow-up type: In-patient   Walker Shadow 02/11/2020, 3:29 PM

## 2020-02-13 ENCOUNTER — Ambulatory Visit: Payer: Self-pay

## 2020-02-13 NOTE — Lactation Note (Signed)
This note was copied from a baby's chart. Lactation Consultation Note  Patient Name: [name] Date: 02/13/2020 Reason for consult: Follow-up assessment;NICU baby;Late-preterm 34-36.6wks  RN requested LC assist/assess baby breastfeeding.  Baby boy is 54 weeks old AGA 35w 0d weighing 5 lbs 7.8 oz today.  Assisted Mom with positioning and latching to breast.  Baby latched deeply onto breast for 4 minutes before keeping breast in baby's mouth and becoming sleepy.  Reassured Mom that baby's feeding behavior was normal for being 35 wks.  Mom is very realistic and understands.    Encouraged keeping baby STS as much as possible, offering breast with cues.  Mom encouraged to continue her consistent pumping to maintain her milk supply.    Mom aware of lactation support and encouraged to ask for LC prn.  Feeding Feeding Type: Breast Fed  LATCH Score Latch: Repeated attempts needed to sustain latch, nipple held in mouth throughout feeding, stimulation needed to elicit sucking reflex.  Audible Swallowing: None  Type of Nipple: Everted at rest and after stimulation  Comfort (Breast/Nipple): Soft / non-tender  Hold (Positioning): Assistance needed to correctly position infant at breast and maintain latch.  LATCH Score: 6  Interventions Interventions: Breast feeding basics reviewed;Assisted with latch;Skin to skin;Breast massage;Hand express;DEBP;Support pillows;Position options;Breast compression;Adjust position   Consult Status Consult Status: Follow-up Date: 02/14/20 Follow-up type: In-patient    [name] 02/13/2020, 4:19 PM

## 2020-02-14 ENCOUNTER — Ambulatory Visit: Payer: Self-pay

## 2020-02-14 NOTE — Lactation Note (Signed)
This note was copied from a baby's chart. Lactation Consultation Note  Patient Name: Mindy Bradford Date: 02/14/2020 Reason for consult: Follow-up assessment;NICU baby   I briefly rounded on Ms. Callies's room. She had just completed feeding baby prior to entry and she was pumping. LC saw her yesterday, and she had no questions or concerns at this time. She reports that the breast feeding algorithm is in place, and her son just fed for about 10 minutes. She is post-pumping with each feeding and reports that she obtains around 30 mls (a little less after breast feeding). She states that she hears baby swallow at the breast.   I invited her to call today for help as needed, and she verbalized understanding.   Consult Status Consult Status: Follow-up Date: 02/15/20 Follow-up type: In-patient    Walker Shadow 02/14/2020, 9:08 AM

## 2020-02-16 ENCOUNTER — Ambulatory Visit: Payer: Self-pay

## 2020-02-16 NOTE — Lactation Note (Signed)
This note was copied from a baby's chart. Lactation Consultation Note Spoke w/RN about baby's feeding. RN stated it went well. Mom put baby to the breast then supplemented afterwards. Mom in rm. Spoke w/mom how feeding went. Mom stated it went well. Baby latched well per mom and then supplemented w/bottle afterwards. Mom stated she didn't need anything at this time. Mom chose to use bottles verses SNS 5 fr. Tubing and syring.  Patient Name: Boy Starsha Morning OINOM'V Date: 02/16/2020     Maternal Data    Feeding Feeding Type: Breast Milk  LATCH Score Latch: Grasps breast easily, tongue down, lips flanged, rhythmical sucking.  Audible Swallowing: Spontaneous and intermittent  Type of Nipple: Everted at rest and after stimulation  Comfort (Breast/Nipple): Soft / non-tender  Hold (Positioning): No assistance needed to correctly position infant at breast.  LATCH Score: 10  Interventions Interventions: Breast massage;Adjust position;Support pillows;Position options  Lactation Tools Discussed/Used Tools: Bottle   Consult Status      Teyon Odette, Diamond Nickel 02/16/2020, 11:00 PM

## 2020-02-16 NOTE — Lactation Note (Signed)
This note was copied from a baby's chart. Lactation Consultation Note  Patient Name: [name] HQION'G Date: 02/16/2020 Reason for consult: Follow-up assessment;NICU baby;Late-preterm 34-36.6wks;Preterm <34wks  1400 - I followed up with [name] after speaking with the assigned RN, Gwendolyn Grant. Baby is ad lib breast feeding since yesterday. However, RN mentioned a concern regarding infant weight loss and cluster feeding behavior (baby feeding every 1-2 hours on demand). She asked lactation for a consult.  I spoke with [name]. She concurred that baby is breast feeding quite frequently. He cues every 1-2 hours and feeds approximately 13 minutes. I missed his feeding at this time and offered to return. [name] is continuing to pump, and she has some EBM in the refrigerator. She is post-pumping about 1-2 ounces based on the volume in the refrigerator.  We discussed baby's GA and feeding behavior. Based on behavior, weight, and GA, I recommended that supplementation would be indicated. As per the LPI protocol developed by lactation, we typically recommend breast feeding first, and then supplementing (either during or after). Continued pumping is encouraged. I discussed developmental readiness for exclusive breast feeding, and while baby is showing excellent progress, supplementation would prevent further weight loss.  Per notes in chart from 02/15/20, baby has had some difficulty with bottle feeding. Speech consult is scheduled for tomorrow, but there is no SLP on unit today. The RN and I called the NP today regarding our concerns, and we agreed to provide baby post breast feeding supplement by either bottle or gavage. I also offered to help with feeding via 5 [place] feeding tube and syringe (modified SNS).   I was unable to assist with breast feeding as my timing was off (I entered the room on two occasions and bot times, I missed the feedings. I left communication with LC who will be here  overnight tonight to follow up with [name] if possible.  I encouraged Ms. Min to allow baby to soften one breast before offering the other breast. It's unclear if baby will have the energy to take both breasts in a feeding; this was discussed.  [name] was amenable to supplementation. I offered lots of praise for her efforts to breast feed and for baby's progress; I stated that we wanted to help support baby's progress at the breast, and she was in agreement.  Feeding Feeding Type: Breast Fed   Lactation Tools Discussed/Used Pump Review: Setup, frequency, and cleaning   Consult Status Consult Status: Follow-up Date: 02/17/20 Follow-up type: In-patient    Walker Shadow 02/16/2020, 3:19 PM

## 2020-03-07 DIAGNOSIS — Z419 Encounter for procedure for purposes other than remedying health state, unspecified: Secondary | ICD-10-CM | POA: Diagnosis not present

## 2020-04-01 DIAGNOSIS — F53 Postpartum depression: Secondary | ICD-10-CM | POA: Diagnosis not present

## 2020-04-07 DIAGNOSIS — Z419 Encounter for procedure for purposes other than remedying health state, unspecified: Secondary | ICD-10-CM | POA: Diagnosis not present

## 2020-05-07 DIAGNOSIS — Z419 Encounter for procedure for purposes other than remedying health state, unspecified: Secondary | ICD-10-CM | POA: Diagnosis not present

## 2020-05-21 DIAGNOSIS — Z6837 Body mass index (BMI) 37.0-37.9, adult: Secondary | ICD-10-CM | POA: Diagnosis not present

## 2020-05-21 DIAGNOSIS — E6609 Other obesity due to excess calories: Secondary | ICD-10-CM | POA: Diagnosis not present

## 2020-05-21 DIAGNOSIS — Z794 Long term (current) use of insulin: Secondary | ICD-10-CM | POA: Diagnosis not present

## 2020-05-21 DIAGNOSIS — E1165 Type 2 diabetes mellitus with hyperglycemia: Secondary | ICD-10-CM | POA: Diagnosis not present

## 2020-05-28 DIAGNOSIS — Z3201 Encounter for pregnancy test, result positive: Secondary | ICD-10-CM | POA: Diagnosis not present

## 2020-05-28 DIAGNOSIS — Z113 Encounter for screening for infections with a predominantly sexual mode of transmission: Secondary | ICD-10-CM | POA: Diagnosis not present

## 2020-05-28 DIAGNOSIS — Z3480 Encounter for supervision of other normal pregnancy, unspecified trimester: Secondary | ICD-10-CM | POA: Diagnosis not present

## 2020-05-28 DIAGNOSIS — N925 Other specified irregular menstruation: Secondary | ICD-10-CM | POA: Diagnosis not present

## 2020-05-29 ENCOUNTER — Other Ambulatory Visit: Payer: Self-pay | Admitting: Obstetrics & Gynecology

## 2020-05-29 DIAGNOSIS — Z8759 Personal history of other complications of pregnancy, childbirth and the puerperium: Secondary | ICD-10-CM

## 2020-06-04 DIAGNOSIS — E1165 Type 2 diabetes mellitus with hyperglycemia: Secondary | ICD-10-CM | POA: Diagnosis not present

## 2020-06-04 DIAGNOSIS — O24111 Pre-existing diabetes mellitus, type 2, in pregnancy, first trimester: Secondary | ICD-10-CM | POA: Diagnosis not present

## 2020-06-04 DIAGNOSIS — Z6837 Body mass index (BMI) 37.0-37.9, adult: Secondary | ICD-10-CM | POA: Diagnosis not present

## 2020-06-04 DIAGNOSIS — Z794 Long term (current) use of insulin: Secondary | ICD-10-CM | POA: Diagnosis not present

## 2020-06-04 DIAGNOSIS — E669 Obesity, unspecified: Secondary | ICD-10-CM | POA: Diagnosis not present

## 2020-06-04 DIAGNOSIS — E1169 Type 2 diabetes mellitus with other specified complication: Secondary | ICD-10-CM | POA: Diagnosis not present

## 2020-06-04 DIAGNOSIS — Z3A00 Weeks of gestation of pregnancy not specified: Secondary | ICD-10-CM | POA: Diagnosis not present

## 2020-06-07 DIAGNOSIS — Z419 Encounter for procedure for purposes other than remedying health state, unspecified: Secondary | ICD-10-CM | POA: Diagnosis not present

## 2020-07-01 DIAGNOSIS — Z369 Encounter for antenatal screening, unspecified: Secondary | ICD-10-CM | POA: Diagnosis not present

## 2020-07-01 DIAGNOSIS — Z3480 Encounter for supervision of other normal pregnancy, unspecified trimester: Secondary | ICD-10-CM | POA: Diagnosis not present

## 2020-07-01 LAB — OB RESULTS CONSOLE HIV ANTIBODY (ROUTINE TESTING): HIV: NONREACTIVE

## 2020-07-01 LAB — OB RESULTS CONSOLE RUBELLA ANTIBODY, IGM: Rubella: IMMUNE

## 2020-07-01 LAB — OB RESULTS CONSOLE RPR: RPR: NONREACTIVE

## 2020-07-01 LAB — OB RESULTS CONSOLE HEPATITIS B SURFACE ANTIGEN: Hepatitis B Surface Ag: NEGATIVE

## 2020-07-03 ENCOUNTER — Other Ambulatory Visit: Payer: Self-pay | Admitting: Obstetrics & Gynecology

## 2020-07-03 ENCOUNTER — Encounter (HOSPITAL_COMMUNITY): Payer: Self-pay | Admitting: Obstetrics & Gynecology

## 2020-07-03 DIAGNOSIS — Z8759 Personal history of other complications of pregnancy, childbirth and the puerperium: Secondary | ICD-10-CM

## 2020-07-07 DIAGNOSIS — Z3A00 Weeks of gestation of pregnancy not specified: Secondary | ICD-10-CM | POA: Diagnosis not present

## 2020-07-07 DIAGNOSIS — E1169 Type 2 diabetes mellitus with other specified complication: Secondary | ICD-10-CM | POA: Diagnosis not present

## 2020-07-07 DIAGNOSIS — E1165 Type 2 diabetes mellitus with hyperglycemia: Secondary | ICD-10-CM | POA: Diagnosis not present

## 2020-07-07 DIAGNOSIS — Z794 Long term (current) use of insulin: Secondary | ICD-10-CM | POA: Diagnosis not present

## 2020-07-07 DIAGNOSIS — Z6838 Body mass index (BMI) 38.0-38.9, adult: Secondary | ICD-10-CM | POA: Diagnosis not present

## 2020-07-07 DIAGNOSIS — Z7689 Persons encountering health services in other specified circumstances: Secondary | ICD-10-CM | POA: Diagnosis not present

## 2020-07-07 DIAGNOSIS — E669 Obesity, unspecified: Secondary | ICD-10-CM | POA: Diagnosis not present

## 2020-07-07 DIAGNOSIS — O24111 Pre-existing diabetes mellitus, type 2, in pregnancy, first trimester: Secondary | ICD-10-CM | POA: Diagnosis not present

## 2020-07-08 DIAGNOSIS — Z419 Encounter for procedure for purposes other than remedying health state, unspecified: Secondary | ICD-10-CM | POA: Diagnosis not present

## 2020-07-10 ENCOUNTER — Other Ambulatory Visit: Payer: Self-pay | Admitting: Obstetrics & Gynecology

## 2020-07-10 ENCOUNTER — Encounter: Payer: Self-pay | Admitting: *Deleted

## 2020-07-11 ENCOUNTER — Other Ambulatory Visit: Payer: Self-pay

## 2020-07-11 ENCOUNTER — Ambulatory Visit: Payer: Medicaid Other | Attending: Obstetrics & Gynecology

## 2020-07-11 ENCOUNTER — Ambulatory Visit (HOSPITAL_BASED_OUTPATIENT_CLINIC_OR_DEPARTMENT_OTHER): Payer: Medicaid Other | Admitting: Maternal & Fetal Medicine

## 2020-07-11 ENCOUNTER — Ambulatory Visit: Payer: Medicaid Other | Admitting: *Deleted

## 2020-07-11 ENCOUNTER — Encounter: Payer: Self-pay | Admitting: *Deleted

## 2020-07-11 VITALS — BP 112/58 | HR 84

## 2020-07-11 DIAGNOSIS — Z9889 Other specified postprocedural states: Secondary | ICD-10-CM | POA: Diagnosis not present

## 2020-07-11 DIAGNOSIS — O020 Blighted ovum and nonhydatidiform mole: Secondary | ICD-10-CM

## 2020-07-11 DIAGNOSIS — O30041 Twin pregnancy, dichorionic/diamniotic, first trimester: Secondary | ICD-10-CM | POA: Insufficient documentation

## 2020-07-11 DIAGNOSIS — Z8759 Personal history of other complications of pregnancy, childbirth and the puerperium: Secondary | ICD-10-CM | POA: Diagnosis not present

## 2020-07-11 DIAGNOSIS — Z289 Immunization not carried out for unspecified reason: Secondary | ICD-10-CM

## 2020-07-11 DIAGNOSIS — O99211 Obesity complicating pregnancy, first trimester: Secondary | ICD-10-CM | POA: Diagnosis not present

## 2020-07-11 DIAGNOSIS — Z3A13 13 weeks gestation of pregnancy: Secondary | ICD-10-CM

## 2020-07-11 DIAGNOSIS — Z7689 Persons encountering health services in other specified circumstances: Secondary | ICD-10-CM | POA: Diagnosis not present

## 2020-07-11 NOTE — Progress Notes (Signed)
MFM Consultation  Date of Service: 07/11/20 Reason for request: Co-existing molar pregnancy Requesting provider: Lyda Kalata, MD  Mindy Bradford is a G4P3 who is being seen at 62 w 0 d by LMP and confirmed by first trimester ultrasound.   She referred at the request of Dr. Wendi Snipes regarding abnormal placental findings in twin pregnancy. She had an NIPT drawn (we do not have the actual report) suggestive of vanishing twin or triploidy no documentation regarding gender was mentioned.  She overall feels good without complaints of vaginal bleeding or uterine cramping.  Her pregnancy issues included:  1) T2 Diabetes- due to limited time we were not able to address this issue. Mindy Bradford was dependant on a ride which compressed our time together.   2) Co-existing molar pregnancy   Vitals with BMI 07/11/2020 02/01/2020 01/31/2020  Height - - -  Weight - - -  BMI - - -  Systolic 272 536 644  Diastolic 58 76 74  Pulse 84 99 101   CBC Latest Ref Rng & Units 01/31/2020 01/31/2020 01/30/2020  WBC 4.0 - 10.5 K/uL 29.1(H) 32.1(H) 19.6(H)  Hemoglobin 12.0 - 15.0 g/dL 11.2(L) 12.0 12.3  Hematocrit 36.0 - 46.0 % 34.3(L) 36.4 38.0  Platelets 150 - 400 K/uL 288 273 264   CMP Latest Ref Rng & Units 04/22/2016 08/06/2015  Glucose 65 - 99 mg/dL 118(H) 127(H)  BUN 6 - 20 mg/dL 8 6  Creatinine 0.44 - 1.00 mg/dL 0.63 0.64  Sodium 135 - 145 mmol/L 137 140  Potassium 3.5 - 5.1 mmol/L 4.1 4.0  Chloride 101 - 111 mmol/L 105 105  CO2 22 - 32 mmol/L 25 23  Calcium 8.9 - 10.3 mg/dL 9.1 9.7   Past Medical History:  Diagnosis Date  . 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 DM2 (diabetes mellitus, type 2) ([place])   . Vaginal Pap smear, abnormal    Past Surgical History:  Procedure Laterality Date  . CHOLECYSTECTOMY  2008  . LEEP  5 years ago   normal results  . WISDOM TOOTH EXTRACTION     age 77   Social History   Socioeconomic History  . Marital status: Single     Spouse name: Not on file  . Number of children: Not on file  . Years of education: Not on file  . Highest education level: Not on file  Occupational History  . Not on file  Tobacco Use  . Smoking status: Current Every Day Smoker    Packs/day: 0.25    Years: 8.00    Pack years: 2.00    Types: Cigarettes  . Smokeless tobacco: Never Used  Vaping Use  . Vaping Use: Never used  Substance and Sexual Activity  . Alcohol use: No  . Drug use: No  . Sexual activity: Yes    Birth control/protection: None    Comment: wants birth control  Other Topics Concern  . Not on file  Social History Narrative  . Not on file   Social Determinants of Health   Financial Resource Strain: Not on file  Food Insecurity: Not on file  Transportation Needs: Not on file  Physical Activity: Not on file  Stress: Not on file  Social Connections: Not on file  Intimate Partner Violence: Not on file   Family History  Problem Relation Age of Onset  . Asthma Mother   . Asthma Sister   . ADD / ADHD Sister   . ADD / ADHD Brother   .  Heart disease Other   . Cancer Other   . Diabetes Other   . Hyperlipidemia Other   . Hypertension Other    Current Outpatient Medications on File Prior to Visit  Medication Sig Dispense Refill  . aspirin-acetaminophen-caffeine (EXCEDRIN MIGRAINE) 250-250-65 MG tablet Take 1 tablet by mouth every 6 (six) hours as needed for headache.    . insulin aspart (NOVOLOG) 100 UNIT/ML injection Inject 6 Units into the skin 3 (three) times daily with meals. 10 mL 11  . insulin NPH Human (NOVOLIN N) 100 UNIT/ML injection Inject 0.1 mLs (10 Units total) into the skin daily before breakfast. 10 mL 11  . insulin NPH Human (NOVOLIN N) 100 UNIT/ML injection Inject 0.12 mLs (12 Units total) into the skin at bedtime. 10 mL 11  . oxyCODONE (OXY IR/ROXICODONE) 5 MG immediate release tablet Take 1 tablet (5 mg total) by mouth every 4 (four) hours as needed (pain scale 4-7). 30 tablet 0   No current  facility-administered medications on file prior to visit.    Imaging:   Today we observed a twin intrauterine pregnancy with a normal appearing twin and placenta in one sac and a molar pregnancy with no evidence of fetal parts in the other sac. The NT of the normal appearing pregnancy was 2.3 mm Good cardiac activity.  The ovaries were mildly enlarged by no evidence of thec luten cyst.   Impression/Counseling:  1) Co-existing twin pregnancy:  I reviewed via images the findings of today's ultrasound. She observed the cardiac activity in one twin and grape like placental mass in the other sac.  We then discussed that these pregnancies are rare and outcomes are ominous but there are families that due have live born children but are often born preterm.  According to the review by Shirleen Schirmer and Doreen Salvage 2005   -25% have live birth -35% will develop persistent trophoblastic disease - 20% develop early preeclampsia -30% result in fetal loss either through late miscarriage IUFD or neonatal death - Increased risk for development of hyperthyroidism  The management include assessing assessing the normalcy of the existing pregnancy both in karyotype and anatomical survey.   Second, serial screening of GTD and fetal growth.  Thirdly, obtain baseline preeclampsia labs and Hcg.  Fourthly - some reports suggest serial chest x-rays to rule out metastasis  Fifth- post delivery monthly hcg levels to detect persistent gestational trophoblastic disease   At the conclusion of our consultation Mindy Bradford expressed a desire to proceed with CVS to assess risk for aneuploidy. We discussed a 1:500 risk of loss due to the procedure but also expressed that the risk of loss may be higher given the abnormal pregnancy. She also met with Dr. Donalee Citrin who will be performing the CVS procedure the following Thursday.  Lastly, we did not have enough time to discuss the management of her T2DM, we will discuss at  her follow up appointment.  All questions answered.  I spent 45 minute with > 50% in face to face consultation.  I discussed the plan of care with Dr. Wendi Snipes as well.  Vikki Ports, MD.

## 2020-07-14 ENCOUNTER — Other Ambulatory Visit: Payer: Self-pay | Admitting: *Deleted

## 2020-07-14 DIAGNOSIS — O283 Abnormal ultrasonic finding on antenatal screening of mother: Secondary | ICD-10-CM

## 2020-07-15 DIAGNOSIS — O24112 Pre-existing diabetes mellitus, type 2, in pregnancy, second trimester: Secondary | ICD-10-CM | POA: Diagnosis not present

## 2020-07-15 DIAGNOSIS — Z794 Long term (current) use of insulin: Secondary | ICD-10-CM | POA: Diagnosis not present

## 2020-07-15 DIAGNOSIS — Z3A00 Weeks of gestation of pregnancy not specified: Secondary | ICD-10-CM | POA: Diagnosis not present

## 2020-07-15 DIAGNOSIS — E119 Type 2 diabetes mellitus without complications: Secondary | ICD-10-CM | POA: Diagnosis not present

## 2020-07-17 ENCOUNTER — Ambulatory Visit: Payer: Self-pay | Admitting: Genetic Counselor

## 2020-07-17 ENCOUNTER — Other Ambulatory Visit: Payer: Self-pay

## 2020-07-17 ENCOUNTER — Ambulatory Visit: Payer: Medicaid Other | Attending: Maternal & Fetal Medicine

## 2020-07-17 ENCOUNTER — Encounter: Payer: Self-pay | Admitting: *Deleted

## 2020-07-17 ENCOUNTER — Ambulatory Visit (HOSPITAL_BASED_OUTPATIENT_CLINIC_OR_DEPARTMENT_OTHER): Payer: Medicaid Other | Admitting: Obstetrics and Gynecology

## 2020-07-17 ENCOUNTER — Ambulatory Visit: Payer: Medicaid Other | Admitting: *Deleted

## 2020-07-17 ENCOUNTER — Other Ambulatory Visit: Payer: Self-pay | Admitting: *Deleted

## 2020-07-17 ENCOUNTER — Ambulatory Visit (HOSPITAL_BASED_OUTPATIENT_CLINIC_OR_DEPARTMENT_OTHER): Payer: Medicaid Other | Admitting: Genetic Counselor

## 2020-07-17 ENCOUNTER — Other Ambulatory Visit: Payer: Self-pay | Admitting: Maternal & Fetal Medicine

## 2020-07-17 VITALS — BP 123/60 | HR 105 | Wt 247.0 lb

## 2020-07-17 DIAGNOSIS — O30041 Twin pregnancy, dichorionic/diamniotic, first trimester: Secondary | ICD-10-CM | POA: Insufficient documentation

## 2020-07-17 DIAGNOSIS — O020 Blighted ovum and nonhydatidiform mole: Secondary | ICD-10-CM

## 2020-07-17 DIAGNOSIS — O99211 Obesity complicating pregnancy, first trimester: Secondary | ICD-10-CM | POA: Insufficient documentation

## 2020-07-17 DIAGNOSIS — Z3A13 13 weeks gestation of pregnancy: Secondary | ICD-10-CM

## 2020-07-17 DIAGNOSIS — O010 Classical hydatidiform mole: Secondary | ICD-10-CM | POA: Diagnosis not present

## 2020-07-17 DIAGNOSIS — O24111 Pre-existing diabetes mellitus, type 2, in pregnancy, first trimester: Secondary | ICD-10-CM

## 2020-07-17 DIAGNOSIS — O3441 Maternal care for other abnormalities of cervix, first trimester: Secondary | ICD-10-CM | POA: Diagnosis not present

## 2020-07-17 DIAGNOSIS — Z315 Encounter for genetic counseling: Secondary | ICD-10-CM | POA: Diagnosis not present

## 2020-07-17 DIAGNOSIS — O289 Unspecified abnormal findings on antenatal screening of mother: Secondary | ICD-10-CM | POA: Diagnosis not present

## 2020-07-17 DIAGNOSIS — O24311 Unspecified pre-existing diabetes mellitus in pregnancy, first trimester: Secondary | ICD-10-CM | POA: Diagnosis not present

## 2020-07-17 DIAGNOSIS — O283 Abnormal ultrasonic finding on antenatal screening of mother: Secondary | ICD-10-CM | POA: Insufficient documentation

## 2020-07-17 DIAGNOSIS — O09211 Supervision of pregnancy with history of pre-term labor, first trimester: Secondary | ICD-10-CM

## 2020-07-17 DIAGNOSIS — O24119 Pre-existing diabetes mellitus, type 2, in pregnancy, unspecified trimester: Secondary | ICD-10-CM | POA: Insufficient documentation

## 2020-07-17 DIAGNOSIS — O285 Abnormal chromosomal and genetic finding on antenatal screening of mother: Secondary | ICD-10-CM | POA: Diagnosis not present

## 2020-07-17 DIAGNOSIS — O99331 Smoking (tobacco) complicating pregnancy, first trimester: Secondary | ICD-10-CM

## 2020-07-17 NOTE — Progress Notes (Signed)
07/17/2020  TREVOR DUTY November 20, 1985 MRN: [id number] DOV: 07/17/2020  Mindy Bradford presented to the [facility] for a genetics consultation regarding her abnormal noninvasive prenatal screening (NIPS) results and twin molar pregnancy. Ms. Farner was accompanied to her appointment by her mother, Alvis Lemmings.   Indication for genetic counseling - NIPS high-risk for vanishing twin, unrecognized multiple gestation, or triploidy - Co-existing twin molar pregnancy  Prenatal history  Ms. Claybrook is a [id number], 35 y.o. female. Her current pregnancy has completed [redacted]w[redacted]d (Estimated Date of Delivery: 01/16/21). She and her partner have a 92 year old son, an 72 year old son, and a 95 month old son together. Her 31 month old son was born premature at 36 weeks.  Ms. Ayub denied exposure to environmental toxins or chemical agents. She denied the use of alcohol and street drugs. She reported smoking half a pack of cigarettes per day. She is attempting to cut back on her smoking more. She denied significant viral illnesses, fevers, and bleeding during the course of her pregnancy. She has a history of type 2 diabetes. She has a history of a LEEP procedure and a cholecystectomy. Her medical and surgical histories were otherwise noncontributory.  Prenatal exposure to tobacco can increase the risk for placenta previa and placental abruption, preterm birth, low birth weight, oral clefts, stillbirth, and sudden infant death syndrome (SIDS). Prenatal exposure to nicotine is also associated with an increased likelihood of neurological disorder and asthma. The risk of pregnancy complications associated with cigarette exposure tends to increase with the amount of cigarettes a woman smokes. I praised Ms. Romberger for cutting back on her smoking and encouraged her to continue reducing the number of cigarettes smoked per day with the goal to eventually quit smoking altogether.  Family History  A three generation  pedigree was drafted and reviewed. The family history is remarkable for the following:  - Ms. Plack's mother has a paternal half brother whose daughter is "slow". She had limited information about this individual beyond that. We discussed that many times, learning difficulties are multifactorial in nature, occurring due to a combination of genetic and environmental factors that are difficult to identify. Given that this relative is distantly related to Ms. Lasky, the risk for learning difficulties of some kind in Ms. Alf's children may not be greatly elevated above that of the general population. However, without further information about the precise cause of learning difficulties in the family, risk assessment is limited.  The remaining family histories were reviewed and found to be noncontributory for birth defects, intellectual disability, recurrent pregnancy loss, and known genetic conditions.    The patient's ancestry [place], [place], and [place]. The father of the pregnancy's ancestry is [place]. Ashkenazi Jewish ancestry and consanguinity were denied. Pedigree will be scanned under Media.  Discussion  Molar pregnancy:  Ms. 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   We discussed that a molar pregnancy is a rare complication of pregnancy characterized by the abnormal growth of trophoblasts, or cells that typically develop into the placenta. In a molar pregnancy, the placenta develops into a mass of cysts that cannot provide nutrients and oxygen to a fetus like it does in a healthy pregnancy. This leads to the development of a tumor within the uterus. Molar pregnancies are rare themselves, with a concurrent twin pregnancy occurring even less  commonly. The incidence of pregnancies in which there is one viable fetus and one complete mole is  estimated to be between 1 in 22,000 to 1 in 100,000.  Ms. Tienken was counseled that there are two types of molar pregnancies. In a complete molar pregnancy, the placental tissue is abnormal and consists of fluid-filled cysts. There is no formation of fetal tissue in a complete molar pregnancy. In a partial molar pregnancy, there may be normal placental tissue along with abnormally formed placental tissue. There may also be formation of a fetus or fetal parts in a partial molar pregnancy.  We discussed that molar pregnancies are caused by cytogenetic anomalies that occur during fertilization. Partial molar pregnancies are often caused by diandric triploidy. Triploidy is a chromosomal abnormality caused by the presence of an entire extra set of chromosomes. Instead of having 46 chromosomes, fetuses with triploidy have 67. Triploidy can either be digynic, with an extra set of maternal chromosomes, or diandric, with an extra set of paternal chromosomes. Triploidy is a fatal chromosome condition that most frequently results in miscarriage.   While partial molar pregnancies are often due to diandric triploidy, complete molar pregnancies are often caused by the presence of 46 paternal chromosomes and no maternal chromosomes (paternal uniparental diploidy). This can occur when an empty egg is fertilized by one diploid sperm or two haploid sperm. Both complete and partial molar pregnancies occur by chance and are not associated with maternal age.  We briefly discussed the possible risks associated with a concurrent twin molar pregnancy; however, these risks were discussed in much further detail with Dr. Judeth Cornfield following Ms. Ricard's genetic counseling consultation. Ms. Jen was informed that there is a higher likelihood for pregnancy complications such as miscarriage or fetal loss of the viable twin, preeclampsia, vaginal bleeding, and premature delivery. There are also several possible maternal health complications,  including hyperthyroidism, and gestational trophoblastic neoplasia.   NIPS result:  Ms. Windon had Panorama noninvasive prenatal screening (NIPS) through [place] that came back with high-risk results suggesting either a vanishing twin, an unrecognized multiple gestation, or an increased risk of triploidy for the pregnancy.  We discussed that NIPS analyzes cell free DNA originating from the placenta that is found in the maternal blood circulation during pregnancy. This test is not diagnostic for chromosome conditions, but can provide information regarding the presence or absence of extra fetal DNA for chromosomes 13, 18, 21, and the sex chromosomes. Panorama NIPS utilizes single nucleotide polymorphisms (SNPs) to distinguish maternal from placental DNA. For a singleton euploid pregnancy, two fetal SNP profiles (haplotypes) are expected. NIPS results from pregnancies affected by a vanishing twin, dizygotic twin pregnancy, or triploidy would all look similar due to the presence of too much fetal genetic material. In each of these cases, three fetal haplotypes may be identified rather than the expected two. Since the current pregnancy is technically a twin gestation and since there is a chromosomally abnormal molar pregnancy, this is likely the explanation for Ms. Arreguin's abnormal NIPS result. Since the test was abnormal, there was no risk estimates provided for chromosomal aneuploidies in the viable fetus.  We discussed that all females have an age-related risk to have a fetus affected by a chromosomal aneuploidy such as trisomy 62, trisomy 63, or trisomy 40. We briefly reviewed features of these conditions. Ms. Marana was counseled that at her age and in the first trimester, the chance of the viable fetus having a chromosomal aneuploidy is 1 in 230 (0.4%). The chance for  the viable fetus to have Down syndrome specifically is 1 in 310 (0.3%) in the first trimester.   Testing options:  We discussed diagnostic  testing as an available option to determine if the viable twin has a chromosomal aneuploidy. We reviewed that diagnostic testing via chorionic villus sampling (CVS) is available at this point in pregnancy and that diagnostic testing via amniocentesis is available from 16 weeks' gestation. We discussed the technical aspects of each procedure and quoted up to a 1 in 500 (0.2%) risk for spontaneous pregnancy loss or other adverse pregnancy outcomes as a result of either procedure. Cultured cells from either a placental or amniotic fluid sample allow for the visualization of a fetal karyotype, which can detect >99% of large chromosomal aberrations. Chromosomal microarray can also be performed to identify smaller deletions or duplications of fetal chromosomal material. We discussed that while diagnostic testing could help to determine if the viable fetus is chromosomally normal, it also may be utilized to confirm the presence of a complete mole versus a partial mole in the other sac. However, we reviewed that Ms. Shaft's pregnancy would be managed in the same way regardless of whether a complete or partial mole is identified.   We also reviewed the benefits and limitations of CVS versus amniocentesis. CVS is able to provide diagnostic information earlier, which allows for more pregnancy management options to remain available. A limitation to CVS is the possibility of confined placental mosaicism. CVS assesses placental tissue which should be representative of the fetus given that they are made up of the same sperm and egg cells; however, in 1-2% of cases, confined placental mosaicism may be of concern. If mosaicism was suspected in the viable twin based on results from CVS, it would be recommended that follow-up with amniocentesis be performed to assess whether mosaicism is present in the fetus (as amniotic fluid contains cells directly from the fetus) or is confined to the placenta.  Pregnancy management  options:  We also discussed Ms. Naumann's pregnancy management options in detail. Firstly, one may consider elective termination of pregnancy due to the possible fetal and maternal health risks in the pregnancy. Some individuals may opt to proceed with diagnostic testing to determine if the viable twin has a chromosomal abnormality before making a decision regarding termination of pregnancy. While the risk of gestational trophoblastic neoplasia appears to remain the same regardless of whether a pregnancy with a complete mole is terminated or continued, the risk for other complications would be decreased if the pregnancy were terminated.  Secondly, we discussed the option of continuing the pregnancy with comprehensive prenatal care and close surveillance. Regular hCG monitoring would be recommended to assess for gestational trophoblastic neoplasia both during and following pregnancy. Ms. Un would be co-managed by several specialists both for her health and for prenatal care.  Ms. Juma informed me that she is confident that she would not terminate the pregnancy regardless of whether or not the viable twin had a chromosomal abnormality. She was initially interested in pursuing diagnostic testing via CVS to determine if the viable twin had a chromosomal abnormality for planning purposes. However, after her ultrasound and her discussion with Dr. Judeth Cornfield, she opted to decline CVS.   Plan:  Ms. Nethery declined diagnostic testing at this time. She understands that amniocentesis is available at any point from 96 weeks' gestation through the end of pregnancy and that she may opt to undergo the procedure at a later date should she change her mind.  Ms. Vanzile  was well-informed about the possible risks for both the viable twin and maternal health that are associated with the complete molar pregnancy. She was willing to accept these risks in hopes that the viable twin is able to survive until birth. Ms. Harbison was  appropriately emotional about the information presented to her. She expressed interest in support resources for individuals who have been in a similar sitution. We made a plan for me to research support options and email ones I find to her.   I counseled Ms. Heathcock regarding the above risks and available options. The approximate face-to-face time with the genetic counselor was 45 minutes.  In summary:  Reviewed results of ultrasound  Twin pregnancy with viable twin/normal placenta and coexisting (suspected) complete mole in other sac   Discussed information about molar pregnancies, including information on causes and possible maternal/fetal health complications   Reviewed NIPS result  Panorama NIPS high risk for vanishing twin, unrecognized multiple gestation, or triploidy  Result likely due to twin gestation/molar pregnancy  Offered additional testing and screening  Declined chorionic villus sampling  Reviewed family history concerns   Gershon Crane, MS, Aeronautical engineer

## 2020-07-17 NOTE — Progress Notes (Signed)
Maternal-Fetal Medicine   Name: Mindy Bradford DOB: 03/13/1986 MRN: [id number] Referring Provider: Lyda Kalata, MD  I had the pleasure of seeing Mindy Bradford today at the [facility].  She was accompanied by her mother. Patient is a G4 P3 at 13w 6d gestation and is here for a follow-up ultrasound and consultation. 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  Patient was seen last week and had consultation with Dr. Gertie Exon. Patient met with our genetic counselor today and you will be receiving a separate letter.  PMH: Pregestational diabetes. Patient takes lantus insulin 48 units in the morning and Humalog 13/13/13 units with meals. She is being followed by her endocrinologist. She reports her blood glucose levels are better now and she may use insulin pump. No history of hypertension or any other chronic medical conditions. PSH: Lap cholecystectomy, LEEP. Medications: Prenatal vitamins, insulin, will start taking low-dose aspirin. Allergies: Sulfa drugs (rashes). Social: Smokes  PPD, no alcohol or drug use. She is single and her partner is Latino and he is in good health.  Family: Mother had renal cancer and nephrectomy (left). She has benign brain tumor that is being observed. Father has hypertension. No history of venous thromboembolism in the family.  Gyn history: History of abnormal Pap smears and LEEP several years ago. No history of breast disease. Obstetric history is significant for 2 term vaginal deliveries and 1 preterm vaginal delivery. In August 2021, at Boy[place]' gestation, she had PPROM and preterm delivery. All her children are in good health.  Prenatal course: No history of vaginal bleeding or hyperemesis. Cell-free fetal DNA screening identified twin pregnancy but results could not be given.   Ultrasound We performed both transabdominal and transvaginal ultrasound to assess pregnancy.  A dichorionic-diamniotic  twin gestational sac with a normal-appearing fetus and a molar pregnancy (in the other sac) is seen consistent with complete hydatidiform mole (CHM). Fetus: Fetal anatomical survey that could be ascertained at this gestational age appears normal. Nasal bone is seen. No evidence of cystic hygroma. Placenta appears normal with no molar changes. The cervix appears normal. Both ovaries are normal with no evidence of enlargement or cysts.  Our concerns include: Twin pregnancy with a complete hydatidiform mole -Very few studies are available. My counseling was based on 2 articles and some case reports (1) Obstetric outcomes of twin pregnancies presenting with a complete hydatidiform mole and coexistent normal fetus: a systematic review and meta-analysis, Justice Deeds al. DOI: 10.1111/1471-0528.16283 [employer]se; 2) Complete hydatidiform mole with co-existing fetus: Predictors of live birth. Suksai et al. [email or web address]). -Information was available on 244 cases with the diagnosis of twin pregnancy with CHM. -Sixty-two pregnancies were terminated (25.4%) and the outcome was available for 182 ongoing pregnancies. -Maternal complications were seen in 81% of cases. -Vaginal bleeding occurred in 70%. -Preeclampsia complicated 90% of cases (11% in twin pregnancies is expected). -Maternal hyperthyroidism was seen in 23% of cases. Thyrotoxicosis can occur in some patients. -Overall, gestational trophoblastic neoplasia (GTN) is seen in about one-third of patients (34%) and the incidence was NOT lower in women who underwent termination of pregnancy. -Preterm delivery occurred in 80% of cases. Vaginal bleeding can lead to PPROM and preterm delivery. -Live birth is seen in 50% and intrauterine fetal demise in 40%. -Bilateral theca lutein cysts are seen in about 25% of cases.  No information is available on the karyotypes of the fetuses. No increased association with chromosomal anomalies  are expected.  I counseled the patient on the increased likelihood of maternal and fetal complications. Pregnancy management requires multidisciplinary team including Gyn oncologists and endocrinologists. We do not expect GTN complications during pregnancy and the patient was made aware of possible chemotherapy if GTN is present. Thyrotoxicosis is a possible complication and will require hospitalization for management. Extreme preterm delivery can occur with severe neurodevelopmental outcomes including cerebral palsy in the newborn. Patient was counseled on preeclampsia and that early-onset preeclampsia may lead to delivery of extremely preterm infant.  I explained molar pregnancy with help of ultrasound images. Although complete molar pregnancy is more likely, partial mole (triploidy) or placental mesenchymal dysplasia can be present (identified only pathology). Maternal complications are similar in all cases.  Patient expressed desire to have invasive procedure to determine the fetal karyotype. I explained chorionic villus sampling (CVS) procedure (that can be performed today) and its possible complication of miscarriage (1 in 500 procedures). Miscarriage rate in this pregnancy is higher because of co-existent molar pregnancy and it is independent of the procedure performed. Alternatively, she can opt for amniocentesis (after 15 weeks) that avoids the likelihood of placental mosaicism.  After counseling, she discussed with her mother (patient and her mother were allowed privacy) and she opted not to have CVS procedure today. She is very firm in her decision to continue her pregnancy and is aware of possible maternal and fetal complications.   Recommendations: -Baseline beta hCG. -Baseline X-ray chest and then every 3 months or earlier if she has symptoms. -An appointment was made for her to return in 2 weeks/4 weeks/6 weeks for ultrasound and cervical length measurements. -Fetal anatomical survey  in 6 weeks. -Anticipate anemia from vaginal bleeding. Iron supplements may be necessary. -Watch for early-onset preeclampsia. -Gyn oncology referral to be initiated. -Patient has an endocrinologist who needs to be aware of the possibility of hyperthyroidism.  I discussed the findings and recommendations with Dr. Shellia Cleverly.   Thank you for consultation. Please contact me if you have any questions.  Consultation including face-to-face counseling 45 minutes.

## 2020-07-22 DIAGNOSIS — E1165 Type 2 diabetes mellitus with hyperglycemia: Secondary | ICD-10-CM | POA: Diagnosis not present

## 2020-07-22 DIAGNOSIS — Z794 Long term (current) use of insulin: Secondary | ICD-10-CM | POA: Diagnosis not present

## 2020-07-22 DIAGNOSIS — Z9641 Presence of insulin pump (external) (internal): Secondary | ICD-10-CM | POA: Diagnosis not present

## 2020-07-22 DIAGNOSIS — Z7689 Persons encountering health services in other specified circumstances: Secondary | ICD-10-CM | POA: Diagnosis not present

## 2020-07-25 DIAGNOSIS — Z7689 Persons encountering health services in other specified circumstances: Secondary | ICD-10-CM | POA: Diagnosis not present

## 2020-07-25 DIAGNOSIS — Z9641 Presence of insulin pump (external) (internal): Secondary | ICD-10-CM | POA: Diagnosis not present

## 2020-07-25 DIAGNOSIS — Z794 Long term (current) use of insulin: Secondary | ICD-10-CM | POA: Diagnosis not present

## 2020-07-25 DIAGNOSIS — E1165 Type 2 diabetes mellitus with hyperglycemia: Secondary | ICD-10-CM | POA: Diagnosis not present

## 2020-07-28 DIAGNOSIS — Z3A15 15 weeks gestation of pregnancy: Secondary | ICD-10-CM | POA: Diagnosis not present

## 2020-07-28 DIAGNOSIS — Z794 Long term (current) use of insulin: Secondary | ICD-10-CM | POA: Diagnosis not present

## 2020-07-28 DIAGNOSIS — O3122X1 Continuing pregnancy after intrauterine death of one fetus or more, second trimester, fetus 1: Secondary | ICD-10-CM | POA: Diagnosis not present

## 2020-07-28 DIAGNOSIS — Z7689 Persons encountering health services in other specified circumstances: Secondary | ICD-10-CM | POA: Diagnosis not present

## 2020-07-28 DIAGNOSIS — Z3687 Encounter for antenatal screening for uncertain dates: Secondary | ICD-10-CM | POA: Diagnosis not present

## 2020-07-28 DIAGNOSIS — O4392 Unspecified placental disorder, second trimester: Secondary | ICD-10-CM | POA: Diagnosis not present

## 2020-07-28 DIAGNOSIS — O089 Unspecified complication following an ectopic and molar pregnancy: Secondary | ICD-10-CM | POA: Diagnosis not present

## 2020-07-28 DIAGNOSIS — O24312 Unspecified pre-existing diabetes mellitus in pregnancy, second trimester: Secondary | ICD-10-CM | POA: Diagnosis not present

## 2020-07-28 DIAGNOSIS — E669 Obesity, unspecified: Secondary | ICD-10-CM | POA: Diagnosis not present

## 2020-07-28 DIAGNOSIS — O09212 Supervision of pregnancy with history of pre-term labor, second trimester: Secondary | ICD-10-CM | POA: Diagnosis not present

## 2020-07-28 DIAGNOSIS — O30042 Twin pregnancy, dichorionic/diamniotic, second trimester: Secondary | ICD-10-CM | POA: Diagnosis not present

## 2020-07-28 DIAGNOSIS — O99212 Obesity complicating pregnancy, second trimester: Secondary | ICD-10-CM | POA: Diagnosis not present

## 2020-07-28 DIAGNOSIS — O24112 Pre-existing diabetes mellitus, type 2, in pregnancy, second trimester: Secondary | ICD-10-CM | POA: Diagnosis not present

## 2020-07-28 DIAGNOSIS — O020 Blighted ovum and nonhydatidiform mole: Secondary | ICD-10-CM | POA: Diagnosis not present

## 2020-07-28 DIAGNOSIS — Z363 Encounter for antenatal screening for malformations: Secondary | ICD-10-CM | POA: Diagnosis not present

## 2020-07-31 ENCOUNTER — Ambulatory Visit: Payer: Medicaid Other

## 2020-08-05 DIAGNOSIS — Z419 Encounter for procedure for purposes other than remedying health state, unspecified: Secondary | ICD-10-CM | POA: Diagnosis not present

## 2020-08-06 DIAGNOSIS — Z3A16 16 weeks gestation of pregnancy: Secondary | ICD-10-CM | POA: Diagnosis not present

## 2020-08-06 DIAGNOSIS — O020 Blighted ovum and nonhydatidiform mole: Secondary | ICD-10-CM | POA: Diagnosis not present

## 2020-08-06 DIAGNOSIS — O30002 Twin pregnancy, unspecified number of placenta and unspecified number of amniotic sacs, second trimester: Secondary | ICD-10-CM | POA: Diagnosis not present

## 2020-08-06 DIAGNOSIS — Z3A00 Weeks of gestation of pregnancy not specified: Secondary | ICD-10-CM | POA: Diagnosis not present

## 2020-08-06 DIAGNOSIS — Z7689 Persons encountering health services in other specified circumstances: Secondary | ICD-10-CM | POA: Diagnosis not present

## 2020-08-13 DIAGNOSIS — O24119 Pre-existing diabetes mellitus, type 2, in pregnancy, unspecified trimester: Secondary | ICD-10-CM | POA: Diagnosis not present

## 2020-08-13 DIAGNOSIS — Z9641 Presence of insulin pump (external) (internal): Secondary | ICD-10-CM | POA: Diagnosis not present

## 2020-08-13 DIAGNOSIS — Z3A17 17 weeks gestation of pregnancy: Secondary | ICD-10-CM | POA: Diagnosis not present

## 2020-08-13 DIAGNOSIS — Z7689 Persons encountering health services in other specified circumstances: Secondary | ICD-10-CM | POA: Diagnosis not present

## 2020-08-13 DIAGNOSIS — Z794 Long term (current) use of insulin: Secondary | ICD-10-CM | POA: Diagnosis not present

## 2020-08-13 DIAGNOSIS — R7989 Other specified abnormal findings of blood chemistry: Secondary | ICD-10-CM | POA: Diagnosis not present

## 2020-08-13 DIAGNOSIS — E1165 Type 2 diabetes mellitus with hyperglycemia: Secondary | ICD-10-CM | POA: Diagnosis not present

## 2020-08-14 ENCOUNTER — Ambulatory Visit: Payer: Medicaid Other

## 2020-08-15 DIAGNOSIS — O24112 Pre-existing diabetes mellitus, type 2, in pregnancy, second trimester: Secondary | ICD-10-CM | POA: Diagnosis not present

## 2020-08-15 DIAGNOSIS — O09892 Supervision of other high risk pregnancies, second trimester: Secondary | ICD-10-CM | POA: Diagnosis not present

## 2020-08-15 DIAGNOSIS — Z8751 Personal history of pre-term labor: Secondary | ICD-10-CM | POA: Diagnosis not present

## 2020-08-15 DIAGNOSIS — O09212 Supervision of pregnancy with history of pre-term labor, second trimester: Secondary | ICD-10-CM | POA: Diagnosis not present

## 2020-08-15 DIAGNOSIS — O99212 Obesity complicating pregnancy, second trimester: Secondary | ICD-10-CM | POA: Diagnosis not present

## 2020-08-15 DIAGNOSIS — O99332 Smoking (tobacco) complicating pregnancy, second trimester: Secondary | ICD-10-CM | POA: Diagnosis not present

## 2020-08-15 DIAGNOSIS — O020 Blighted ovum and nonhydatidiform mole: Secondary | ICD-10-CM | POA: Diagnosis not present

## 2020-08-15 DIAGNOSIS — O2392 Unspecified genitourinary tract infection in pregnancy, second trimester: Secondary | ICD-10-CM | POA: Diagnosis not present

## 2020-08-15 DIAGNOSIS — O30042 Twin pregnancy, dichorionic/diamniotic, second trimester: Secondary | ICD-10-CM | POA: Diagnosis not present

## 2020-08-15 DIAGNOSIS — E119 Type 2 diabetes mellitus without complications: Secondary | ICD-10-CM | POA: Diagnosis not present

## 2020-08-15 DIAGNOSIS — O09292 Supervision of pregnancy with other poor reproductive or obstetric history, second trimester: Secondary | ICD-10-CM | POA: Diagnosis not present

## 2020-08-15 DIAGNOSIS — O0992 Supervision of high risk pregnancy, unspecified, second trimester: Secondary | ICD-10-CM | POA: Diagnosis not present

## 2020-08-15 DIAGNOSIS — O99333 Smoking (tobacco) complicating pregnancy, third trimester: Secondary | ICD-10-CM | POA: Diagnosis not present

## 2020-08-15 DIAGNOSIS — O99112 Other diseases of the blood and blood-forming organs and certain disorders involving the immune mechanism complicating pregnancy, second trimester: Secondary | ICD-10-CM | POA: Diagnosis not present

## 2020-08-15 DIAGNOSIS — O3122X1 Continuing pregnancy after intrauterine death of one fetus or more, second trimester, fetus 1: Secondary | ICD-10-CM | POA: Diagnosis not present

## 2020-08-15 DIAGNOSIS — Z7689 Persons encountering health services in other specified circumstances: Secondary | ICD-10-CM | POA: Diagnosis not present

## 2020-08-15 DIAGNOSIS — Z794 Long term (current) use of insulin: Secondary | ICD-10-CM | POA: Diagnosis not present

## 2020-08-15 DIAGNOSIS — Z3686 Encounter for antenatal screening for cervical length: Secondary | ICD-10-CM | POA: Diagnosis not present

## 2020-08-15 DIAGNOSIS — E1165 Type 2 diabetes mellitus with hyperglycemia: Secondary | ICD-10-CM | POA: Diagnosis not present

## 2020-08-15 DIAGNOSIS — R7989 Other specified abnormal findings of blood chemistry: Secondary | ICD-10-CM | POA: Diagnosis not present

## 2020-08-15 DIAGNOSIS — R8271 Bacteriuria: Secondary | ICD-10-CM | POA: Diagnosis not present

## 2020-08-15 DIAGNOSIS — F17210 Nicotine dependence, cigarettes, uncomplicated: Secondary | ICD-10-CM | POA: Diagnosis not present

## 2020-08-15 DIAGNOSIS — Z3A18 18 weeks gestation of pregnancy: Secondary | ICD-10-CM | POA: Diagnosis not present

## 2020-08-15 DIAGNOSIS — E669 Obesity, unspecified: Secondary | ICD-10-CM | POA: Diagnosis not present

## 2020-08-28 ENCOUNTER — Ambulatory Visit: Payer: Medicaid Other

## 2020-09-03 DIAGNOSIS — O09292 Supervision of pregnancy with other poor reproductive or obstetric history, second trimester: Secondary | ICD-10-CM | POA: Diagnosis not present

## 2020-09-03 DIAGNOSIS — O020 Blighted ovum and nonhydatidiform mole: Secondary | ICD-10-CM | POA: Diagnosis not present

## 2020-09-03 DIAGNOSIS — O99891 Other specified diseases and conditions complicating pregnancy: Secondary | ICD-10-CM | POA: Diagnosis not present

## 2020-09-03 DIAGNOSIS — O99332 Smoking (tobacco) complicating pregnancy, second trimester: Secondary | ICD-10-CM | POA: Diagnosis not present

## 2020-09-03 DIAGNOSIS — Z3A20 20 weeks gestation of pregnancy: Secondary | ICD-10-CM | POA: Diagnosis not present

## 2020-09-03 DIAGNOSIS — O24112 Pre-existing diabetes mellitus, type 2, in pregnancy, second trimester: Secondary | ICD-10-CM | POA: Diagnosis not present

## 2020-09-03 DIAGNOSIS — O089 Unspecified complication following an ectopic and molar pregnancy: Secondary | ICD-10-CM | POA: Diagnosis not present

## 2020-09-03 DIAGNOSIS — Z36 Encounter for antenatal screening for chromosomal anomalies: Secondary | ICD-10-CM | POA: Diagnosis not present

## 2020-09-03 DIAGNOSIS — Z7689 Persons encountering health services in other specified circumstances: Secondary | ICD-10-CM | POA: Diagnosis not present

## 2020-09-03 DIAGNOSIS — O0992 Supervision of high risk pregnancy, unspecified, second trimester: Secondary | ICD-10-CM | POA: Diagnosis not present

## 2020-09-03 DIAGNOSIS — O99212 Obesity complicating pregnancy, second trimester: Secondary | ICD-10-CM | POA: Diagnosis not present

## 2020-09-03 DIAGNOSIS — F17210 Nicotine dependence, cigarettes, uncomplicated: Secondary | ICD-10-CM | POA: Diagnosis not present

## 2020-09-03 DIAGNOSIS — Z794 Long term (current) use of insulin: Secondary | ICD-10-CM | POA: Diagnosis not present

## 2020-09-03 DIAGNOSIS — R030 Elevated blood-pressure reading, without diagnosis of hypertension: Secondary | ICD-10-CM | POA: Diagnosis not present

## 2020-09-03 DIAGNOSIS — O30042 Twin pregnancy, dichorionic/diamniotic, second trimester: Secondary | ICD-10-CM | POA: Diagnosis not present

## 2020-09-03 DIAGNOSIS — E119 Type 2 diabetes mellitus without complications: Secondary | ICD-10-CM | POA: Diagnosis not present

## 2020-09-03 DIAGNOSIS — E669 Obesity, unspecified: Secondary | ICD-10-CM | POA: Diagnosis not present

## 2020-09-03 DIAGNOSIS — Z9641 Presence of insulin pump (external) (internal): Secondary | ICD-10-CM | POA: Diagnosis not present

## 2020-09-05 DIAGNOSIS — Z7689 Persons encountering health services in other specified circumstances: Secondary | ICD-10-CM | POA: Diagnosis not present

## 2020-09-05 DIAGNOSIS — O020 Blighted ovum and nonhydatidiform mole: Secondary | ICD-10-CM | POA: Diagnosis not present

## 2020-09-05 DIAGNOSIS — O24112 Pre-existing diabetes mellitus, type 2, in pregnancy, second trimester: Secondary | ICD-10-CM | POA: Diagnosis not present

## 2020-09-05 DIAGNOSIS — O24119 Pre-existing diabetes mellitus, type 2, in pregnancy, unspecified trimester: Secondary | ICD-10-CM | POA: Diagnosis not present

## 2020-09-05 DIAGNOSIS — Z419 Encounter for procedure for purposes other than remedying health state, unspecified: Secondary | ICD-10-CM | POA: Diagnosis not present

## 2020-09-05 DIAGNOSIS — Z3A21 21 weeks gestation of pregnancy: Secondary | ICD-10-CM | POA: Diagnosis not present

## 2020-09-10 DIAGNOSIS — E010 Iodine-deficiency related diffuse (endemic) goiter: Secondary | ICD-10-CM | POA: Diagnosis not present

## 2020-09-10 DIAGNOSIS — E1165 Type 2 diabetes mellitus with hyperglycemia: Secondary | ICD-10-CM | POA: Diagnosis not present

## 2020-09-10 DIAGNOSIS — Z3A21 21 weeks gestation of pregnancy: Secondary | ICD-10-CM | POA: Diagnosis not present

## 2020-09-10 DIAGNOSIS — Z7689 Persons encountering health services in other specified circumstances: Secondary | ICD-10-CM | POA: Diagnosis not present

## 2020-09-10 DIAGNOSIS — O24119 Pre-existing diabetes mellitus, type 2, in pregnancy, unspecified trimester: Secondary | ICD-10-CM | POA: Diagnosis not present

## 2020-09-10 DIAGNOSIS — Z794 Long term (current) use of insulin: Secondary | ICD-10-CM | POA: Diagnosis not present

## 2020-09-10 DIAGNOSIS — E0590 Thyrotoxicosis, unspecified without thyrotoxic crisis or storm: Secondary | ICD-10-CM | POA: Diagnosis not present

## 2020-09-10 DIAGNOSIS — R7989 Other specified abnormal findings of blood chemistry: Secondary | ICD-10-CM | POA: Diagnosis not present

## 2020-09-12 DIAGNOSIS — O99212 Obesity complicating pregnancy, second trimester: Secondary | ICD-10-CM | POA: Diagnosis not present

## 2020-09-12 DIAGNOSIS — O09292 Supervision of pregnancy with other poor reproductive or obstetric history, second trimester: Secondary | ICD-10-CM | POA: Diagnosis not present

## 2020-09-12 DIAGNOSIS — O24112 Pre-existing diabetes mellitus, type 2, in pregnancy, second trimester: Secondary | ICD-10-CM | POA: Diagnosis not present

## 2020-09-12 DIAGNOSIS — O3122X1 Continuing pregnancy after intrauterine death of one fetus or more, second trimester, fetus 1: Secondary | ICD-10-CM | POA: Diagnosis not present

## 2020-09-12 DIAGNOSIS — E669 Obesity, unspecified: Secondary | ICD-10-CM | POA: Diagnosis not present

## 2020-09-12 DIAGNOSIS — Z3A22 22 weeks gestation of pregnancy: Secondary | ICD-10-CM | POA: Diagnosis not present

## 2020-09-12 DIAGNOSIS — E119 Type 2 diabetes mellitus without complications: Secondary | ICD-10-CM | POA: Diagnosis not present

## 2020-09-12 DIAGNOSIS — O020 Blighted ovum and nonhydatidiform mole: Secondary | ICD-10-CM | POA: Diagnosis not present

## 2020-09-12 DIAGNOSIS — Z7689 Persons encountering health services in other specified circumstances: Secondary | ICD-10-CM | POA: Diagnosis not present

## 2020-09-12 DIAGNOSIS — Z794 Long term (current) use of insulin: Secondary | ICD-10-CM | POA: Diagnosis not present

## 2020-09-12 DIAGNOSIS — R030 Elevated blood-pressure reading, without diagnosis of hypertension: Secondary | ICD-10-CM | POA: Diagnosis not present

## 2020-09-12 DIAGNOSIS — Z3686 Encounter for antenatal screening for cervical length: Secondary | ICD-10-CM | POA: Diagnosis not present

## 2020-09-12 DIAGNOSIS — O99891 Other specified diseases and conditions complicating pregnancy: Secondary | ICD-10-CM | POA: Diagnosis not present

## 2020-09-12 DIAGNOSIS — O09212 Supervision of pregnancy with history of pre-term labor, second trimester: Secondary | ICD-10-CM | POA: Diagnosis not present

## 2020-09-12 DIAGNOSIS — O26892 Other specified pregnancy related conditions, second trimester: Secondary | ICD-10-CM | POA: Diagnosis not present

## 2020-09-26 DIAGNOSIS — Z7689 Persons encountering health services in other specified circumstances: Secondary | ICD-10-CM | POA: Diagnosis not present

## 2020-09-26 DIAGNOSIS — O288 Other abnormal findings on antenatal screening of mother: Secondary | ICD-10-CM | POA: Diagnosis not present

## 2020-09-26 DIAGNOSIS — O09212 Supervision of pregnancy with history of pre-term labor, second trimester: Secondary | ICD-10-CM | POA: Diagnosis not present

## 2020-09-26 DIAGNOSIS — O26892 Other specified pregnancy related conditions, second trimester: Secondary | ICD-10-CM | POA: Diagnosis not present

## 2020-09-26 DIAGNOSIS — O3102X0 Papyraceous fetus, second trimester, not applicable or unspecified: Secondary | ICD-10-CM | POA: Diagnosis not present

## 2020-09-26 DIAGNOSIS — O99212 Obesity complicating pregnancy, second trimester: Secondary | ICD-10-CM | POA: Diagnosis not present

## 2020-09-26 DIAGNOSIS — R030 Elevated blood-pressure reading, without diagnosis of hypertension: Secondary | ICD-10-CM | POA: Diagnosis not present

## 2020-09-26 DIAGNOSIS — O020 Blighted ovum and nonhydatidiform mole: Secondary | ICD-10-CM | POA: Diagnosis not present

## 2020-09-26 DIAGNOSIS — O99332 Smoking (tobacco) complicating pregnancy, second trimester: Secondary | ICD-10-CM | POA: Diagnosis not present

## 2020-09-26 DIAGNOSIS — R946 Abnormal results of thyroid function studies: Secondary | ICD-10-CM | POA: Diagnosis not present

## 2020-09-26 DIAGNOSIS — O24112 Pre-existing diabetes mellitus, type 2, in pregnancy, second trimester: Secondary | ICD-10-CM | POA: Diagnosis not present

## 2020-09-26 DIAGNOSIS — Z794 Long term (current) use of insulin: Secondary | ICD-10-CM | POA: Diagnosis not present

## 2020-09-26 DIAGNOSIS — O09892 Supervision of other high risk pregnancies, second trimester: Secondary | ICD-10-CM | POA: Diagnosis not present

## 2020-09-26 DIAGNOSIS — F17210 Nicotine dependence, cigarettes, uncomplicated: Secondary | ICD-10-CM | POA: Diagnosis not present

## 2020-09-26 DIAGNOSIS — O30042 Twin pregnancy, dichorionic/diamniotic, second trimester: Secondary | ICD-10-CM | POA: Diagnosis not present

## 2020-09-26 DIAGNOSIS — O019 Hydatidiform mole, unspecified: Secondary | ICD-10-CM | POA: Diagnosis not present

## 2020-09-26 DIAGNOSIS — Z3A24 24 weeks gestation of pregnancy: Secondary | ICD-10-CM | POA: Diagnosis not present

## 2020-09-26 DIAGNOSIS — E1165 Type 2 diabetes mellitus with hyperglycemia: Secondary | ICD-10-CM | POA: Diagnosis not present

## 2020-09-26 DIAGNOSIS — E119 Type 2 diabetes mellitus without complications: Secondary | ICD-10-CM | POA: Diagnosis not present

## 2020-10-05 DIAGNOSIS — Z419 Encounter for procedure for purposes other than remedying health state, unspecified: Secondary | ICD-10-CM | POA: Diagnosis not present

## 2020-10-06 DIAGNOSIS — O09292 Supervision of pregnancy with other poor reproductive or obstetric history, second trimester: Secondary | ICD-10-CM | POA: Diagnosis not present

## 2020-10-06 DIAGNOSIS — E119 Type 2 diabetes mellitus without complications: Secondary | ICD-10-CM | POA: Diagnosis not present

## 2020-10-06 DIAGNOSIS — Z794 Long term (current) use of insulin: Secondary | ICD-10-CM | POA: Diagnosis not present

## 2020-10-06 DIAGNOSIS — E669 Obesity, unspecified: Secondary | ICD-10-CM | POA: Diagnosis not present

## 2020-10-06 DIAGNOSIS — Z3686 Encounter for antenatal screening for cervical length: Secondary | ICD-10-CM | POA: Diagnosis not present

## 2020-10-06 DIAGNOSIS — O09212 Supervision of pregnancy with history of pre-term labor, second trimester: Secondary | ICD-10-CM | POA: Diagnosis not present

## 2020-10-06 DIAGNOSIS — O30042 Twin pregnancy, dichorionic/diamniotic, second trimester: Secondary | ICD-10-CM | POA: Diagnosis not present

## 2020-10-06 DIAGNOSIS — O020 Blighted ovum and nonhydatidiform mole: Secondary | ICD-10-CM | POA: Diagnosis not present

## 2020-10-06 DIAGNOSIS — O99212 Obesity complicating pregnancy, second trimester: Secondary | ICD-10-CM | POA: Diagnosis not present

## 2020-10-06 DIAGNOSIS — Z3A25 25 weeks gestation of pregnancy: Secondary | ICD-10-CM | POA: Diagnosis not present

## 2020-10-06 DIAGNOSIS — Z7689 Persons encountering health services in other specified circumstances: Secondary | ICD-10-CM | POA: Diagnosis not present

## 2020-10-06 DIAGNOSIS — O24112 Pre-existing diabetes mellitus, type 2, in pregnancy, second trimester: Secondary | ICD-10-CM | POA: Diagnosis not present

## 2020-10-09 DIAGNOSIS — Z7689 Persons encountering health services in other specified circumstances: Secondary | ICD-10-CM | POA: Diagnosis not present

## 2020-10-09 DIAGNOSIS — E041 Nontoxic single thyroid nodule: Secondary | ICD-10-CM | POA: Diagnosis not present

## 2020-10-13 DIAGNOSIS — M79606 Pain in leg, unspecified: Secondary | ICD-10-CM | POA: Diagnosis not present

## 2020-10-13 DIAGNOSIS — E041 Nontoxic single thyroid nodule: Secondary | ICD-10-CM | POA: Diagnosis not present

## 2020-10-13 DIAGNOSIS — M7989 Other specified soft tissue disorders: Secondary | ICD-10-CM | POA: Diagnosis not present

## 2020-10-13 DIAGNOSIS — O020 Blighted ovum and nonhydatidiform mole: Secondary | ICD-10-CM | POA: Diagnosis not present

## 2020-10-13 DIAGNOSIS — E1165 Type 2 diabetes mellitus with hyperglycemia: Secondary | ICD-10-CM | POA: Diagnosis not present

## 2020-10-13 DIAGNOSIS — F17210 Nicotine dependence, cigarettes, uncomplicated: Secondary | ICD-10-CM | POA: Diagnosis not present

## 2020-10-13 DIAGNOSIS — O24112 Pre-existing diabetes mellitus, type 2, in pregnancy, second trimester: Secondary | ICD-10-CM | POA: Diagnosis not present

## 2020-10-13 DIAGNOSIS — R946 Abnormal results of thyroid function studies: Secondary | ICD-10-CM | POA: Diagnosis not present

## 2020-10-13 DIAGNOSIS — E0590 Thyrotoxicosis, unspecified without thyrotoxic crisis or storm: Secondary | ICD-10-CM | POA: Diagnosis not present

## 2020-10-13 DIAGNOSIS — Z794 Long term (current) use of insulin: Secondary | ICD-10-CM | POA: Diagnosis not present

## 2020-10-13 DIAGNOSIS — E669 Obesity, unspecified: Secondary | ICD-10-CM | POA: Diagnosis not present

## 2020-10-13 DIAGNOSIS — E119 Type 2 diabetes mellitus without complications: Secondary | ICD-10-CM | POA: Diagnosis not present

## 2020-10-13 DIAGNOSIS — O24119 Pre-existing diabetes mellitus, type 2, in pregnancy, unspecified trimester: Secondary | ICD-10-CM | POA: Diagnosis not present

## 2020-10-13 DIAGNOSIS — H538 Other visual disturbances: Secondary | ICD-10-CM | POA: Diagnosis not present

## 2020-10-13 DIAGNOSIS — Z7689 Persons encountering health services in other specified circumstances: Secondary | ICD-10-CM | POA: Diagnosis not present

## 2020-10-13 DIAGNOSIS — R7989 Other specified abnormal findings of blood chemistry: Secondary | ICD-10-CM | POA: Diagnosis not present

## 2020-10-16 ENCOUNTER — Observation Stay (EMERGENCY_DEPARTMENT_HOSPITAL)
Admission: AD | Admit: 2020-10-16 | Discharge: 2020-10-16 | Disposition: A | Discharge status: Home / Self Care | Payer: Medicaid Other | Source: Home / Self Care | Attending: Obstetrics & Gynecology | Admitting: Obstetrics & Gynecology

## 2020-10-16 ENCOUNTER — Inpatient Hospital Stay (HOSPITAL_BASED_OUTPATIENT_CLINIC_OR_DEPARTMENT_OTHER): Payer: Medicaid Other

## 2020-10-16 ENCOUNTER — Encounter (HOSPITAL_COMMUNITY): Payer: Self-pay | Admitting: Obstetrics & Gynecology

## 2020-10-16 ENCOUNTER — Other Ambulatory Visit: Payer: Self-pay

## 2020-10-16 DIAGNOSIS — Z825 Family history of asthma and other chronic lower respiratory diseases: Secondary | ICD-10-CM

## 2020-10-16 DIAGNOSIS — Z7982 Long term (current) use of aspirin: Secondary | ICD-10-CM

## 2020-10-16 DIAGNOSIS — E0590 Thyrotoxicosis, unspecified without thyrotoxic crisis or storm: Secondary | ICD-10-CM | POA: Diagnosis present

## 2020-10-16 DIAGNOSIS — O9852 Other viral diseases complicating childbirth: Secondary | ICD-10-CM | POA: Diagnosis present

## 2020-10-16 DIAGNOSIS — O30042 Twin pregnancy, dichorionic/diamniotic, second trimester: Secondary | ICD-10-CM | POA: Diagnosis not present

## 2020-10-16 DIAGNOSIS — O289 Unspecified abnormal findings on antenatal screening of mother: Secondary | ICD-10-CM

## 2020-10-16 DIAGNOSIS — Z3A26 26 weeks gestation of pregnancy: Secondary | ICD-10-CM

## 2020-10-16 DIAGNOSIS — O09212 Supervision of pregnancy with history of pre-term labor, second trimester: Secondary | ICD-10-CM

## 2020-10-16 DIAGNOSIS — O3122X2 Continuing pregnancy after intrauterine death of one fetus or more, second trimester, fetus 2: Secondary | ICD-10-CM | POA: Diagnosis not present

## 2020-10-16 DIAGNOSIS — F17210 Nicotine dependence, cigarettes, uncomplicated: Secondary | ICD-10-CM | POA: Diagnosis present

## 2020-10-16 DIAGNOSIS — O99332 Smoking (tobacco) complicating pregnancy, second trimester: Secondary | ICD-10-CM | POA: Diagnosis not present

## 2020-10-16 DIAGNOSIS — O010 Classical hydatidiform mole: Secondary | ICD-10-CM

## 2020-10-16 DIAGNOSIS — O321XX0 Maternal care for breech presentation, not applicable or unspecified: Secondary | ICD-10-CM | POA: Diagnosis not present

## 2020-10-16 DIAGNOSIS — O4692 Antepartum hemorrhage, unspecified, second trimester: Secondary | ICD-10-CM | POA: Diagnosis not present

## 2020-10-16 DIAGNOSIS — E669 Obesity, unspecified: Secondary | ICD-10-CM | POA: Diagnosis not present

## 2020-10-16 DIAGNOSIS — O99412 Diseases of the circulatory system complicating pregnancy, second trimester: Secondary | ICD-10-CM | POA: Diagnosis present

## 2020-10-16 DIAGNOSIS — O081 Delayed or excessive hemorrhage following ectopic and molar pregnancy: Secondary | ICD-10-CM | POA: Diagnosis not present

## 2020-10-16 DIAGNOSIS — Z8249 Family history of ischemic heart disease and other diseases of the circulatory system: Secondary | ICD-10-CM

## 2020-10-16 DIAGNOSIS — U071 COVID-19: Secondary | ICD-10-CM | POA: Diagnosis not present

## 2020-10-16 DIAGNOSIS — Z28310 Unvaccinated for covid-19: Secondary | ICD-10-CM

## 2020-10-16 DIAGNOSIS — O364XX2 Maternal care for intrauterine death, fetus 2: Secondary | ICD-10-CM | POA: Diagnosis not present

## 2020-10-16 DIAGNOSIS — O0289 Other abnormal products of conception: Secondary | ICD-10-CM | POA: Diagnosis not present

## 2020-10-16 DIAGNOSIS — O318X22 Other complications specific to multiple gestation, second trimester, fetus 2: Secondary | ICD-10-CM | POA: Diagnosis not present

## 2020-10-16 DIAGNOSIS — O99212 Obesity complicating pregnancy, second trimester: Secondary | ICD-10-CM | POA: Diagnosis not present

## 2020-10-16 DIAGNOSIS — Z833 Family history of diabetes mellitus: Secondary | ICD-10-CM

## 2020-10-16 DIAGNOSIS — O30002 Twin pregnancy, unspecified number of placenta and unspecified number of amniotic sacs, second trimester: Secondary | ICD-10-CM | POA: Diagnosis not present

## 2020-10-16 DIAGNOSIS — O24912 Unspecified diabetes mellitus in pregnancy, second trimester: Secondary | ICD-10-CM | POA: Diagnosis present

## 2020-10-16 DIAGNOSIS — O4693 Antepartum hemorrhage, unspecified, third trimester: Secondary | ICD-10-CM | POA: Diagnosis present

## 2020-10-16 DIAGNOSIS — O3442 Maternal care for other abnormalities of cervix, second trimester: Secondary | ICD-10-CM | POA: Diagnosis not present

## 2020-10-16 DIAGNOSIS — R030 Elevated blood-pressure reading, without diagnosis of hypertension: Secondary | ICD-10-CM | POA: Diagnosis present

## 2020-10-16 DIAGNOSIS — Z9049 Acquired absence of other specified parts of digestive tract: Secondary | ICD-10-CM | POA: Diagnosis not present

## 2020-10-16 DIAGNOSIS — O30043 Twin pregnancy, dichorionic/diamniotic, third trimester: Secondary | ICD-10-CM | POA: Diagnosis present

## 2020-10-16 DIAGNOSIS — Z794 Long term (current) use of insulin: Secondary | ICD-10-CM

## 2020-10-16 DIAGNOSIS — O99284 Endocrine, nutritional and metabolic diseases complicating childbirth: Secondary | ICD-10-CM | POA: Diagnosis present

## 2020-10-16 DIAGNOSIS — Z882 Allergy status to sulfonamides status: Secondary | ICD-10-CM

## 2020-10-16 DIAGNOSIS — Z9641 Presence of insulin pump (external) (internal): Secondary | ICD-10-CM | POA: Diagnosis present

## 2020-10-16 DIAGNOSIS — E041 Nontoxic single thyroid nodule: Secondary | ICD-10-CM | POA: Diagnosis present

## 2020-10-16 LAB — GLUCOSE, CAPILLARY
Glucose-Capillary: 87 mg/dL (ref 70–99)
Glucose-Capillary: 106 mg/dL — ABNORMAL HIGH (ref 70–99)
Glucose-Capillary: 116 mg/dL — ABNORMAL HIGH (ref 70–99)
Glucose-Capillary: 232 mg/dL — ABNORMAL HIGH (ref 70–99)
Glucose-Capillary: 192 mg/dL — ABNORMAL HIGH (ref 70–99)

## 2020-10-16 LAB — COMPREHENSIVE METABOLIC PANEL
ALT: 16 U/L (ref 0–44)
AST: 18 U/L (ref 15–41)
Albumin: 2.3 g/dL — ABNORMAL LOW (ref 3.5–5.0)
Alkaline Phosphatase: 81 U/L (ref 38–126)
Anion gap: 8 (ref 5–15)
BUN: <5 mg/dL — ABNORMAL LOW (ref 6–20)
CO2: 20 mmol/L — ABNORMAL LOW (ref 22–32)
Calcium: 8.5 mg/dL — ABNORMAL LOW (ref 8.9–10.3)
Chloride: 107 mmol/L (ref 98–111)
Creatinine, Ser: 0.43 mg/dL — ABNORMAL LOW (ref 0.44–1.00)
GFR, Estimated: >60 mL/min (ref >60)
Glucose, Bld: 113 mg/dL — ABNORMAL HIGH (ref 70–99)
Potassium: 4.2 mmol/L (ref 3.5–5.1)
Sodium: 135 mmol/L (ref 135–145)
Total Bilirubin: 0.5 mg/dL (ref 0.3–1.2)
Total Protein: 5.6 g/dL — ABNORMAL LOW (ref 6.5–8.1)

## 2020-10-16 LAB — CBC
HCT: 36.2 % (ref 36.0–46.0)
HCT: 34.9 % — ABNORMAL LOW (ref 36.0–46.0)
Hemoglobin: 11.9 g/dL — ABNORMAL LOW (ref 12.0–15.0)
Hemoglobin: 11.6 g/dL — ABNORMAL LOW (ref 12.0–15.0)
MCH: 26.8 pg (ref 26.0–34.0)
MCH: 27 pg (ref 26.0–34.0)
MCHC: 32.9 g/dL (ref 30.0–36.0)
MCHC: 33.2 g/dL (ref 30.0–36.0)
MCV: 81.5 fL (ref 80.0–100.0)
MCV: 81.4 fL (ref 80.0–100.0)
Platelets: 153 10*3/uL (ref 150–400)
Platelets: 140 10*3/uL — ABNORMAL LOW (ref 150–400)
RBC: 4.44 MIL/uL (ref 3.87–5.11)
RBC: 4.29 MIL/uL (ref 3.87–5.11)
RDW: 13.6 % (ref 11.5–15.5)
RDW: 13.5 % (ref 11.5–15.5)
WBC: 3.9 10*3/uL — ABNORMAL LOW (ref 4.0–10.5)
WBC: 4 10*3/uL (ref 4.0–10.5)
nRBC: 0 % (ref 0.0–0.2)
nRBC: 0 % (ref 0.0–0.2)

## 2020-10-16 LAB — RESP PANEL BY RT-PCR (FLU A&B, COVID) ARPGX2
Influenza A by PCR: NEGATIVE
Influenza B by PCR: NEGATIVE
SARS Coronavirus 2 by RT PCR: POSITIVE — AB

## 2020-10-16 LAB — PROTEIN / CREATININE RATIO, URINE
Creatinine, Urine: 114.23 mg/dL
Protein Creatinine Ratio: 1.95 mg/mg{Cre} — ABNORMAL HIGH (ref 0.00–0.15)
Total Protein, Urine: 223 mg/dL

## 2020-10-16 MED ORDER — NICOTINE POLACRILEX 2 MG MT GUM
2.0000 mg | CHEWING_GUM | OROMUCOSAL | Status: DC | PRN
  Filled 2020-10-16: qty 1

## 2020-10-16 MED ORDER — MAGNESIUM SULFATE 40 GM/1000ML IV SOLN
2.0000 g/h | INTRAVENOUS | Status: DC
  Filled 2020-10-16: qty 1000

## 2020-10-16 MED ORDER — ACETAMINOPHEN 325 MG PO TABS: 650.0000 mg | ORAL_TABLET | ORAL | Status: DC | PRN

## 2020-10-16 MED ORDER — MAGNESIUM SULFATE BOLUS VIA INFUSION
4.0000 g | Freq: Once | INTRAVENOUS | Status: AC
  Administered 2020-10-16: 4 g via INTRAVENOUS
  Filled 2020-10-16: qty 1000

## 2020-10-16 MED ORDER — BETAMETHASONE SOD PHOS & ACET 6 (3-3) MG/ML IJ SUSP
12.0000 mg | INTRAMUSCULAR | Status: DC
  Administered 2020-10-16: 12 mg via INTRAMUSCULAR
  Filled 2020-10-16: qty 5

## 2020-10-16 MED ORDER — NICOTINE 14 MG/24HR TD PT24: 14.0000 mg | MEDICATED_PATCH | Freq: Every day | TRANSDERMAL | Status: DC

## 2020-10-16 MED ORDER — LACTATED RINGERS IV SOLN: INTRAVENOUS | Status: DC

## 2020-10-16 MED ORDER — INSULIN PUMP
SUBCUTANEOUS | Status: DC
  Filled 2020-10-16: qty 1

## 2020-10-16 MED ORDER — ZOLPIDEM TARTRATE 5 MG PO TABS: 5.0000 mg | ORAL_TABLET | Freq: Every evening | ORAL | Status: DC | PRN

## 2020-10-16 MED ORDER — BETAMETHASONE SOD PHOS & ACET 6 (3-3) MG/ML IJ SUSP
12.0000 mg | Freq: Once | INTRAMUSCULAR | Status: AC
  Administered 2020-10-16: 12 mg via INTRAMUSCULAR
  Filled 2020-10-16: qty 5

## 2020-10-16 MED ORDER — CALCIUM CARBONATE ANTACID 500 MG PO CHEW: 2.0000 | CHEWABLE_TABLET | ORAL | Status: DC | PRN

## 2020-10-16 MED ORDER — DOCUSATE SODIUM 100 MG PO CAPS: 100.0000 mg | ORAL_CAPSULE | Freq: Every day | ORAL | Status: DC

## 2020-10-16 MED ORDER — PRENATAL MULTIVITAMIN CH: 1.0000 | ORAL_TABLET | Freq: Every day | ORAL | Status: DC

## 2020-10-16 NOTE — OB Triage Note (Signed)
An After Visit Summary was printed and given to the patient. Patient reports no questions at this time. Patient will be discharged to home via friend, Hilliard Clark.

## 2020-10-16 NOTE — MAU Provider Note (Signed)
Chief Complaint:  Vaginal Bleeding   Event Date/Time   First Provider Initiated Contact with Patient 10/16/20 0058     HPI: Mindy Bradford is a 35 y.o. [phone] at 65w6dwho presents to maternity admissions reporting heavy vaginal bleeding which started at 2350 tonight.  Denies feeling any pain or contractions.  States feels good fetal movement. . She reports good fetal movement, denies LOF, vaginal itching/burning, urinary symptoms, h/a, dizziness, n/v, diarrhea, constipation or fever/chills.  She denies headache, visual changes or RUQ abdominal pain.  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 The patient's pertinent negatives include no genital itching, genital lesions, genital odor or pelvic pain. This is a new problem. The current episode started today. The problem occurs constantly. The problem has been unchanged. The patient is experiencing no pain. She is pregnant. Pertinent negatives include no abdominal pain, back pain, chills, fever, nausea or vomiting. The vaginal discharge was bloody. The vaginal bleeding is typical of menses. She has not been passing clots. She has not been passing tissue. Nothing aggravates the symptoms. She has tried nothing for the symptoms.   RN Note Pt reports vaginal bleeding at 2350, states it is like a period. Denies pain. Reports good fetal movement.   Past Medical History: Past Medical History:  Diagnosis Date  . 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 DM2 (diabetes mellitus, type 2) (HCC)   . Vaginal Pap smear, abnormal     Past obstetric history: OB History  Gravida Para Term Preterm AB Living  4 3 2 1   3   SAB IAB Ectopic Multiple Live Births        0 3    # Outcome Date GA Lbr Len/2nd Weight Sex  Delivery Anes PTL Lv  4 Current           3 Preterm 01/30/20 [redacted]w[redacted]d 00:59 / 00:17 2230 g M Vag-Spont Local  LIV     Birth Comments: wnl  2 Term 02/15/11 [redacted]w[redacted]d 05:20 / 00:06 3180 g M Vag-Spont None  LIV  1 Term 11/06/04 [redacted]w[redacted]d  2835 g M Vag-Spont None  LIV    Past Surgical History: Past Surgical History:  Procedure Laterality Date  . CHOLECYSTECTOMY  2008  . LEEP  5 years ago   normal results  . WISDOM TOOTH EXTRACTION     age 47    Family History: Family History  Problem Relation Age of Onset  . Asthma Mother   . Asthma Sister   . ADD / ADHD Sister   . ADD / ADHD Brother   . Heart disease Other   . Cancer Other   . Diabetes Other   . Hyperlipidemia Other   . Hypertension Other     Social History: Social History   Tobacco Use  . Smoking status: Current Every Day Smoker    Packs/day: 0.25    Years: 8.00    Pack years: 2.00    Types: Cigarettes  . Smokeless tobacco: Never Used  Vaping Use  . Vaping Use: Never used  Substance Use Topics  . Alcohol use: No  . Drug use: No    Allergies:  Allergies  Allergen Reactions  . Sulfa Antibiotics Rash    Meds:  Medications Prior to Admission  Medication Sig Dispense Refill Last  Dose  . aspirin EC 81 MG tablet Take 81 mg by mouth daily. Swallow whole.     Marland Kitchen aspirin-acetaminophen-caffeine (EXCEDRIN MIGRAINE) 250-250-65 MG tablet Take 1 tablet by mouth every 6 (six) hours as needed for headache.     . Continuous Blood Gluc Sensor (DEXCOM G6 SENSOR) MISC by Does not apply route.     . insulin aspart (NOVOLOG) 100 UNIT/ML injection Inject 6 Units into the skin 3 (three) times daily with meals. 10 mL 11   . insulin glargine (LANTUS) 100 UNIT/ML injection Inject 44 Units into the skin daily.     . insulin NPH Human (NOVOLIN N) 100 UNIT/ML injection Inject 0.1 mLs (10 Units total) into the skin daily before breakfast. 10 mL 11   . insulin NPH Human (NOVOLIN N) 100 UNIT/ML injection Inject 0.12 mLs (12 Units total) into the skin  at bedtime. 10 mL 11   . oxyCODONE (OXY IR/ROXICODONE) 5 MG immediate release tablet Take 1 tablet (5 mg total) by mouth every 4 (four) hours as needed (pain scale 4-7). 30 tablet 0   . Prenatal Vit-Fe Fumarate-FA (PRENATAL MULTIVITAMIN) TABS tablet Take 1 tablet by mouth daily at 12 noon.       I have reviewed patient's Past Medical Hx, Surgical Hx, Family Hx, Social Hx, medications and allergies.   ROS:  Review of Systems  Constitutional: Negative for chills and fever.  Gastrointestinal: Negative for abdominal pain, nausea and vomiting.  Genitourinary: Positive for vaginal bleeding. Negative for pelvic pain.  Musculoskeletal: Negative for back pain.   Other systems negative  Physical Exam   Patient Vitals for the past 24 hrs:  BP Temp Temp src Pulse Resp SpO2 Height Weight  10/16/20 0053 (!) 141/74 98 F (36.7 C) Oral (!) 104 18 98 % 5\' 7"  (1.702 m) 108.9 kg   Vitals:   10/16/20 0053 10/16/20 0221 10/16/20 0422 10/16/20 0426  BP: (!) 141/74 135/89 123/65 124/66  Pulse: (!) 104 (!) 104 90 95  Resp: 18 18 18 17   Temp: 98 F (36.7 C) 98.4 F (36.9 C)    TempSrc: Oral Oral    SpO2: 98%     Weight: 108.9 kg     Height: 5\' 7"  (1.702 m)      Constitutional: Well-developed, well-nourished female in no acute distress.  Cardiovascular: normal rate and rhythm Respiratory: normal effort, clear to auscultation bilaterally GI: Abd soft, non-tender, gravid appropriate for gestational age.   No rebound or guarding. MS: Extremities nontender, no edema, normal ROM Neurologic: Alert and oriented x 4.  GU: Neg CVAT.  PELVIC EXAM: Cervix unable to be visualized due to pooling blood, Mod-heavy bleeding, vault full of watery blood (brownish burgundy), unable to clear enough to see cervix, so I checked her digitally Dilation: 1.5 Effacement (%): 80 Station: Ballotable Exam by:: Geovanny Sartin,cnm  FHT:  Baseline 140s , moderate variability, accelerations present, no decelerations Contractions:  q 3 mins Irregular  Not felt by patient   Labs: Results for orders placed or performed during the hospital encounter of 10/16/20 (from the past 24 hour(s))  CBC on admission     Status: Abnormal   Collection Time: 10/16/20  1:21 AM  Result Value Ref Range   WBC 3.9 (L) 4.0 - 10.5 K/uL   RBC 4.44 3.87 - 5.11 MIL/uL   Hemoglobin 11.9 (L) 12.0 - 15.0 g/dL   HCT 002.002.002.002 12/16/20 - 12/16/20 %   MCV 81.5 80.0 - 100.0 fL   MCH 26.8 26.0 - 34.0  pg   MCHC 32.9 30.0 - 36.0 g/dL   RDW 09.7 35.3 - 29.9 %   Platelets 153 150 - 400 K/uL   nRBC 0.0 0.0 - 0.2 %  Resp Panel by RT-PCR (Flu A&B, Covid) Nasopharyngeal Swab     Status: Abnormal   Collection Time: 10/16/20  1:26 AM   Specimen: Nasopharyngeal Swab; Nasopharyngeal(NP) swabs in vial transport medium  Result Value Ref Range   SARS Coronavirus 2 by RT PCR POSITIVE (A) NEGATIVE   Influenza A by PCR NEGATIVE NEGATIVE   Influenza B by PCR NEGATIVE NEGATIVE  Type and screen [facility]     Status: None   Collection Time: 10/16/20  1:26 AM  Result Value Ref Range   ABO/RH(D) A POS    Antibody Screen NEG    Sample Expiration      10/19/2020,2359 Performed at [facility], [address], [place], [place] [address]   Glucose, capillary     Status: None   Collection Time: 10/16/20  1:35 AM  Result Value Ref Range   Glucose-Capillary 87 70 - 99 mg/dL  CBC     Status: Abnormal   Collection Time: 10/16/20  4:10 AM  Result Value Ref Range   WBC 4.0 4.0 - 10.5 K/uL   RBC 4.29 3.87 - 5.11 MIL/uL   Hemoglobin 11.6 (L) 12.0 - 15.0 g/dL   HCT 34.1 (L) 96.2 - 22.9 %   MCV 81.4 80.0 - 100.0 fL   MCH 27.0 26.0 - 34.0 pg   MCHC 33.2 30.0 - 36.0 g/dL   RDW 79.8 92.1 - 19.4 %   Platelets 140 (L) 150 - 400 K/uL   nRBC 0.0 0.0 - 0.2 %  Glucose, capillary     Status: Abnormal   Collection Time: 10/16/20  4:38 AM  Result Value Ref Range   Glucose-Capillary 106 (H) 70 - 99 mg/dL   --/--/A POS (17/40 8144)  Imaging:  Posterior  placenta Mole seen No evid of previa or abruption Amniotic Fluid normal   MAU Course/MDM: I have ordered labs and reviewed results. [place] ordered for presentation.   NST reviewed, reassuring Consult Dr Debroah Loop with presentation, exam findings and test results.  Will admit to Labor for now due to heavy bleeding.    Assessment: Single IUP at [redacted]w[redacted]d Vaginal bleeding in the second trimester Twin Molar pregnancy (1 fetus and 1 complete Hydatiform Mole) Preterm painless contractions  Plan: Admit to Labor and Delivery EFM Labs ordered MFM and Neonatology consult in AM MD to follow   Wynelle Bourgeois CNM, MSN Certified Nurse-Midwife 10/16/2020 12:58 AM

## 2020-10-16 NOTE — Progress Notes (Signed)
FACULTY PRACTICE ANTEPARTUM(COMPREHENSIVE) NOTE  Mindy Bradford is a 35 y.o. [phone] with Estimated Date of Delivery: 01/16/21   By  best clinical estimate [redacted]w[redacted]d  who is admitted for observation due to vaginal bleeding with known twin pregnancy complicated by one normal fetus and complete hydatidform mole.  Fetal presentation is breech. Length of Stay:  0  Days  Date of admission:10/16/2020  Subjective: Currently, she reports no further bright red bleeding.  Notes great fetal movement.  No contractions, no LOF.  Overall doing well and reports no acute complaints  Vitals:  Blood pressure (!) 118/52, pulse 100, temperature 98.4 F (36.9 C), temperature source Oral, resp. rate 17, height 5\' 7"  (1.702 m), weight 108.9 kg, last menstrual period 04/11/2020, SpO2 98 %, unknown if currently breastfeeding. Vitals:   10/16/20 0801 10/16/20 0910 10/16/20 1001 10/16/20 1301  BP: 120/62 128/68 125/67 (!) 118/52  Pulse: 76 83 98 100  Resp:   17   Temp:      TempSrc:      SpO2:      Weight:      Height:       Physical Examination:  General appearance - alert, well appearing, and in no distress Mental status - normal mood, behavior, speech, dress, motor activity, and thought processes Abdomen - gravid, non-tender Extremities - no pedal edema noted Skin - warm and dry  Fetal Monitoring: 130 bpm, moderate variability, +10x10 accels, no decels, Difficulty with tracing due to positioning  Labs:  Results for orders placed or performed during the hospital encounter of 10/16/20 (from the past 24 hour(s))  CBC on admission   Collection Time: 10/16/20  1:21 AM  Result Value Ref Range   WBC 3.9 (L) 4.0 - 10.5 K/uL   RBC 4.44 3.87 - 5.11 MIL/uL   Hemoglobin 11.9 (L) 12.0 - 15.0 g/dL   HCT 12/16/20 63.8 - 46.6 %   MCV 81.5 80.0 - 100.0 fL   MCH 26.8 26.0 - 34.0 pg   MCHC 32.9 30.0 - 36.0 g/dL   RDW 59.9 35.7 - 01.7 %   Platelets 153 150 - 400 K/uL   nRBC 0.0 0.0 - 0.2 %  Resp Panel by RT-PCR (Flu A&B,  Covid) Nasopharyngeal Swab   Collection Time: 10/16/20  1:26 AM   Specimen: Nasopharyngeal Swab; Nasopharyngeal(NP) swabs in vial transport medium  Result Value Ref Range   SARS Coronavirus 2 by RT PCR POSITIVE (A) NEGATIVE   Influenza A by PCR NEGATIVE NEGATIVE   Influenza B by PCR NEGATIVE NEGATIVE  Type and screen [facility]   Collection Time: 10/16/20  1:26 AM  Result Value Ref Range   ABO/RH(D) A POS    Antibody Screen NEG    Sample Expiration      10/19/2020,2359 Performed at [facility], [address] [place], [place]   Glucose, capillary   Collection Time: 10/16/20  1:35 AM  Result Value Ref Range   Glucose-Capillary 87 70 - 99 mg/dL  CBC   Collection Time: 10/16/20  4:10 AM  Result Value Ref Range   WBC 4.0 4.0 - 10.5 K/uL   RBC 4.29 3.87 - 5.11 MIL/uL   Hemoglobin 11.6 (L) 12.0 - 15.0 g/dL   HCT 12/16/20 (L) 92.3 - 30.0 %   MCV 81.4 80.0 - 100.0 fL   MCH 27.0 26.0 - 34.0 pg   MCHC 33.2 30.0 - 36.0 g/dL   RDW 76.2 26.3 - 33.5 %   Platelets  140 (L) 150 - 400 K/uL   nRBC 0.0 0.0 - 0.2 %  Comprehensive metabolic panel   Collection Time: 10/16/20  4:10 AM  Result Value Ref Range   Sodium 135 135 - 145 mmol/L   Potassium 4.2 3.5 - 5.1 mmol/L   Chloride 107 98 - 111 mmol/L   CO2 20 (L) 22 - 32 mmol/L   Glucose, Bld 113 (H) 70 - 99 mg/dL   BUN <5 (L) 6 - 20 mg/dL   Creatinine, Ser 1.61 (L) 0.44 - 1.00 mg/dL   Calcium 8.5 (L) 8.9 - 10.3 mg/dL   Total Protein 5.6 (L) 6.5 - 8.1 g/dL   Albumin 2.3 (L) 3.5 - 5.0 g/dL   AST 18 15 - 41 U/L   ALT 16 0 - 44 U/L   Alkaline Phosphatase 81 38 - 126 U/L   Total Bilirubin 0.5 0.3 - 1.2 mg/dL   GFR, Estimated >09 >60 mL/min   Anion gap 8 5 - 15  Protein / creatinine ratio, urine   Collection Time: 10/16/20  4:33 AM  Result Value Ref Range   Creatinine, Urine 114.23 mg/dL   Total Protein, Urine 223 mg/dL   Protein Creatinine Ratio 1.95 (H) 0.00 - 0.15 mg/mg[Cre]  Glucose, capillary    Collection Time: 10/16/20  4:38 AM  Result Value Ref Range   Glucose-Capillary 106 (H) 70 - 99 mg/dL  Glucose, capillary   Collection Time: 10/16/20  9:06 AM  Result Value Ref Range   Glucose-Capillary 116 (H) 70 - 99 mg/dL    Imaging Studies:    No results found.   Medications:  Scheduled . betamethasone acetate-betamethasone sodium phosphate  12 mg Intramuscular Once  . docusate sodium  100 mg Oral Daily  . insulin pump   Subcutaneous Q4H  . nicotine  14 mg Transdermal Daily  . prenatal multivitamin  1 tablet Oral Q1200   I have reviewed the patient's current medications.  ASSESSMENT: [id number] [redacted]w[redacted]d Estimated Date of Delivery: 01/16/21 admitted due to vaginal bleeding with known twin pregnancy complicated by hydatiform mole PLAN: 1)Vaginal bleeding  Care reviewed with MFM- Dr. Judeth Cornfield.  Reported that bleeding can occur due to the molar pregnancy.  If bleeding is stable ok for discharge with outpatient follow up with her primary OB group 2) Fetal well being -NICHD- Cat. I, reassuring by doppler and [place] -BMZ given for fetal lung maturity  3) Maternal well being -BP within normal limited, elevated BP noted on arrival.  PC ratio elevated; however likely blood contaminate -T2DM- continue home insulin pump -Hyperthyroid- management per [facility]  DISPO: Reviewed care plan with patient, reassured her that currently fetal status reassuring.  Advised close follow up with her primary OB group  Myna Hidalgo, DO Attending Obstetrician & Gynecologist, [facility] for [employer], [facility]

## 2020-10-16 NOTE — MAU Note (Signed)
Pt reports vaginal bleeding at 2350, states it is like a period. Denies pain. Reports good fetal movement.

## 2020-10-16 NOTE — H&P (Signed)
OBSTETRIC ADMISSION HISTORY AND PHYSICAL  Mindy Bradford is a 35 y.o. female [phone] with IUP at [redacted]w[redacted]d by L/6 presenting for concern of vaginal bleeding that started last night at 2350. Pt reports that the onset of bleeding occurred without any known triggers. No recent sexual activity or GU symptoms. She reports +FMs, No LOF, no blurry vision, headaches or peripheral edema, and RUQ pain.  She plans on breast feeding. She is undecided for birth control. She received her prenatal care at [facility] since 07/2020 (transferred from [facility])   Dating: By L/6 --->  Estimated Date of Delivery: 01/16/21  Sono:  Preliminary Ultrasound 10/16/20: @[redacted]w[redacted]d , breech presentation, AFI wnl  @[redacted]w[redacted]d , CWD, normal anatomy, variable presentation, complete mole. Per ultrasound report "Didi Twin gestational sac with normal-appearing fetus and a molar pregnancy (in the other sac) is seen consistent with complete hydratidiform mole.  @[redacted]w[redacted]d , CWD, normal anatomy, EFW 804g, 37%, normal anatomy, normal AFI [facility])  Prenatal History/Complications:  - Didi Twin pregnancy (1 normal fetus + complete hydratidiform mole; pt declined [facility]) - T2DM (home insulin pump, well-controlled) - H/o PPROM at [redacted] weeks GA - Short Interval Pregnancy - Thyroid nodule of right lobe (recommended FNA given TI-RADS4--not yet performed) - COVID (diagnosed 10/16/20) - Tobacco use (1/2 ppd) - H/o LEEP  Past Medical History: Past Medical History:  Diagnosis Date  . 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 DM2 (diabetes mellitus, type 2) (HCC)   . Vaginal Pap smear, abnormal     Past Surgical History: Past Surgical History:  Procedure Laterality Date  . CHOLECYSTECTOMY  2008  . LEEP  5 years ago   normal results  . WISDOM TOOTH EXTRACTION     age 29    Obstetrical History: OB History    Gravida  4   Para  3   Term  2   Preterm  1   AB      Living  3     SAB      IAB       Ectopic      Multiple  0   Live Births  3           Social History Social History   Socioeconomic History  . Marital status: Single    Spouse name: Not on file  . Number of children: Not on file  . Years of education: Not on file  . Highest education level: Not on file  Occupational History  . Not on file  Tobacco Use  . Smoking status: Current Every Day Smoker    Packs/day: 0.25    Years: 8.00    Pack years: 2.00    Types: Cigarettes  . Smokeless tobacco: Never Used  Vaping Use  . Vaping Use: Never used  Substance and Sexual Activity  . Alcohol use: No  . Drug use: No  . Sexual activity: Yes    Birth control/protection: None    Comment: wants birth control  Other Topics Concern  . Not on file  Social History Narrative  . Not on file   Social Determinants of Health   Financial Resource Strain: Not on file  Food Insecurity: Not on file  Transportation Needs: Not on file  Physical Activity: Not on file  Stress: Not on file  Social Connections: Not on file    Family History: Family History  Problem Relation Age of Onset  . Asthma Mother   . Asthma Sister   .  ADD / ADHD Sister   . ADD / ADHD Brother   . Heart disease Other   . Cancer Other   . Diabetes Other   . Hyperlipidemia Other   . Hypertension Other     Allergies: Allergies  Allergen Reactions  . Sulfa Antibiotics Rash    Medications Prior to Admission  Medication Sig Dispense Refill Last Dose  . insulin aspart (NOVOLOG) 100 UNIT/ML injection Inject 6 Units into the skin 3 (three) times daily with meals. 10 mL 11 10/15/2020 at Unknown time  . insulin glargine (LANTUS) 100 UNIT/ML injection Inject 44 Units into the skin daily.   10/15/2020 at Unknown time  . insulin NPH Human (NOVOLIN N) 100 UNIT/ML injection Inject 0.1 mLs (10 Units total) into the skin daily before breakfast. 10 mL 11 10/15/2020 at Unknown time  . Prenatal Vit-Fe Fumarate-FA (PRENATAL MULTIVITAMIN) TABS tablet Take 1  tablet by mouth daily at 12 noon.   10/15/2020 at Unknown time  . aspirin EC 81 MG tablet Take 81 mg by mouth daily. Swallow whole.     Marland Kitchen aspirin-acetaminophen-caffeine (EXCEDRIN MIGRAINE) 250-250-65 MG tablet Take 1 tablet by mouth every 6 (six) hours as needed for headache.     . Continuous Blood Gluc Sensor (DEXCOM G6 SENSOR) MISC by Does not apply route.     . insulin NPH Human (NOVOLIN N) 100 UNIT/ML injection Inject 0.12 mLs (12 Units total) into the skin at bedtime. 10 mL 11   . oxyCODONE (OXY IR/ROXICODONE) 5 MG immediate release tablet Take 1 tablet (5 mg total) by mouth every 4 (four) hours as needed (pain scale 4-7). 30 tablet 0      Review of Systems   All systems reviewed and negative except as stated in HPI  Blood pressure 133/69, pulse 92, temperature 98.4 F (36.9 C), temperature source Oral, resp. rate 18, height 5\' 7"  (1.702 m), weight 108.9 kg, last menstrual period 04/11/2020, SpO2 98 %, unknown if currently breastfeeding. General appearance: alert, cooperative and appears stated age Lungs: normal WOB Heart: regular rate Abdomen: soft, non-tender Extremities: no sign of DVT Presentation: breech on formal ultrasund Fetal monitoringBaseline: 130 bpm, Variability: Good {> 6 bpm), Accelerations: Reactive and Decelerations: Absent Uterine activityFrequency: Every 5-7 minutes (painless) Dilation: 1.5 Effacement (%): 80 Station: Ballotable Exam by:: [email or web address],cnm  Prenatal labs: ABO, Rh: --/--/A POS (05/12 0126) Antibody: NEG (05/12 0126) Rubella:  immune RPR: NON REACTIVE (08/25 1324)  HBsAg:   negative HIV:   non-reactive GBS:   unknown Genetic screening abnormal (high risk) Anatomy 06-04-1981   Prenatal Transfer Tool  Maternal Diabetes: Yes:  Diabetes Type:  Insulin/Medication controlled Genetic Screening: abnormal  Maternal Ultrasounds/Referrals: Abnormal (didi twins: 1 normal fetus+complete molar pregnancy)  Fetal Ultrasounds or other Referrals:  Referred to Materal  Fetal Medicine  Maternal Substance Abuse:  Tobacco (1/2 ppd) Significant Maternal Medications: insulin pump Significant Maternal Lab Results: GBS unknown, hyperthyroid (maternal thyroid nodule)  Results for orders placed or performed during the hospital encounter of 10/16/20 (from the past 24 hour(s))  CBC on admission   Collection Time: 10/16/20  1:21 AM  Result Value Ref Range   WBC 3.9 (L) 4.0 - 10.5 K/uL   RBC 4.44 3.87 - 5.11 MIL/uL   Hemoglobin 11.9 (L) 12.0 - 15.0 g/dL   HCT 12/16/20 67.6 - 19.5 %   MCV 81.5 80.0 - 100.0 fL   MCH 26.8 26.0 - 34.0 pg   MCHC 32.9 30.0 - 36.0 g/dL   RDW 09.3 26.7 -  15.5 %   Platelets 153 150 - 400 K/uL   nRBC 0.0 0.0 - 0.2 %  Resp Panel by RT-PCR (Flu A&B, Covid) Nasopharyngeal Swab   Collection Time: 10/16/20  1:26 AM   Specimen: Nasopharyngeal Swab; Nasopharyngeal(NP) swabs in vial transport medium  Result Value Ref Range   SARS Coronavirus 2 by RT PCR POSITIVE (A) NEGATIVE   Influenza A by PCR NEGATIVE NEGATIVE   Influenza B by PCR NEGATIVE NEGATIVE  Type and screen [facility]   Collection Time: 10/16/20  1:26 AM  Result Value Ref Range   ABO/RH(D) A POS    Antibody Screen NEG    Sample Expiration      10/19/2020,2359 Performed at [facility], [address], [place], [place] [address]   Glucose, capillary   Collection Time: 10/16/20  1:35 AM  Result Value Ref Range   Glucose-Capillary 87 70 - 99 mg/dL  CBC   Collection Time: 10/16/20  4:10 AM  Result Value Ref Range   WBC 4.0 4.0 - 10.5 K/uL   RBC 4.29 3.87 - 5.11 MIL/uL   Hemoglobin 11.6 (L) 12.0 - 15.0 g/dL   HCT 72.5 (L) 36.6 - 44.0 %   MCV 81.4 80.0 - 100.0 fL   MCH 27.0 26.0 - 34.0 pg   MCHC 33.2 30.0 - 36.0 g/dL   RDW 34.7 42.5 - 95.6 %   Platelets 140 (L) 150 - 400 K/uL   nRBC 0.0 0.0 - 0.2 %  Comprehensive metabolic panel   Collection Time: 10/16/20  4:10 AM  Result Value Ref Range   Sodium 135 135 - 145 mmol/L   Potassium 4.2 3.5 - 5.1  mmol/L   Chloride 107 98 - 111 mmol/L   CO2 20 (L) 22 - 32 mmol/L   Glucose, Bld 113 (H) 70 - 99 mg/dL   BUN <5 (L) 6 - 20 mg/dL   Creatinine, Ser 3.87 (L) 0.44 - 1.00 mg/dL   Calcium 8.5 (L) 8.9 - 10.3 mg/dL   Total Protein 5.6 (L) 6.5 - 8.1 g/dL   Albumin 2.3 (L) 3.5 - 5.0 g/dL   AST 18 15 - 41 U/L   ALT 16 0 - 44 U/L   Alkaline Phosphatase 81 38 - 126 U/L   Total Bilirubin 0.5 0.3 - 1.2 mg/dL   GFR, Estimated >56 >43 mL/min   Anion gap 8 5 - 15  Glucose, capillary   Collection Time: 10/16/20  4:38 AM  Result Value Ref Range   Glucose-Capillary 106 (H) 70 - 99 mg/dL    Patient Active Problem List   Diagnosis Date Noted  . Vaginal bleeding in pregnancy, third trimester 10/16/2020  . Preterm delivery without spontaneous labor 01/31/2020  . Preterm premature rupture of membranes in third trimester 01/30/2020  . Abnormal glucose tolerance test (GTT) during pregnancy, antepartum 10/04/2019  . Smoker unmotivated to quit 08/27/2011    Assessment/Plan:  Mindy Bradford is a 35 y.o. [phone] at [redacted]w[redacted]d here for observation given brisk vaginal bleeding in the setting of twin pregnancy with complete hydatidiform mole and a normal fetus.  #Vaginal Bleeding, Preterm  Breech Fetal Presentation  DiDi Twin Pregnancy (1 normal fetal+complete molar pregnancy): pt with approximately 100cc dark blood in vaginal vault on admission. No previa; bleeding most likely secondary to molar pregnancy. Slow, minimal bleeding observed from external os on sterile spec exam in MAU. Reassuringly, fetus with Category 1 strip and normal AFI on formal ultrasound. - neuroprotective magnesium, BMZ  administered on admission - GBS swab collected (deferred PCN given minimal activity on toco with breech presentation) - plan for MFM & NICU consults in AM - NPO given possible need for OR - continuous fetal monitoring on L&D; plan for Cesarean if worsening bleeding if persistent breech  #Elevated BP without diagnosis:  patient without symptoms of preeclampsia. Given increased risk of Preeclampsia with molar pregnancy, f/u preeclampsia labs collected on admission. #T2DM: continue home insulin pump with q4hr BG checks #Hyperthyroidism  Thyroid Nodule: [facility] but pt has not yet scheduled. #COVID: diagnosed on admission. Mild symptoms. Contact and droplet precautions. #Tobacco Use: nicotine patch, prn gum  Braden Deloach, Skipper ClicheAnna E, MD OB Fellow, Faculty Practice 10/16/2020 5:42 AM

## 2020-10-16 NOTE — Consult Note (Signed)
Maternal-Fetal Medicine (Telephone Consultation)  Name: Mindy Bradford DOB: 05/20/1986 MRN: [id number] Referring Provider: Myna Hidalgo, MD  Mindy Bradford, [place] P3 at 26w 6d gestation with twin pregnancy with complete hydatidiform mole was admitted today with c/o vaginal bleeding. No history of passing clots and the patient reports that vaginal bleeding has significantly decreased. She does not have uterine contractions. At initial evaluation at the MAU, she had moderate bleeding and the cervix was 1.5 cm dilated. After an admission blood pressure of 141/74 mm Hg, subsequent blood pressures have been normal.  NST is reassuring. Patient received betamethasone. At admission screening, she tested positive for COVID-19 infection. She is asymptomatic. Patient does not give history of COVID-19 infection and she is not vaccinated.  Her prenatal course is significant for the diagnosis of twin pregnancy with complete H. Mole. She has a viable singleton pregnancy. Patient was extensively counseled at her 13-week visit at [facility] and she opted to continue her pregnancy. She later transferred her care to [facility] (with Gyn Oncology back up). Her prenatal course has been uneventful so far and this is her first episode of vaginal bleeding. She has been having monthly chest X-rays (negative for malignancy) and biweekly (twice a month) beta hCG levels.  She has type 2 diabetes and is on continuous insulin infusion. She is being followed by her endocrinologist at [facility]. She has a right thyroid hypoechoic nodule (4 cm) and may be undergoing FNA. Most-recent TFT showed suppressed TSH with normal T4/T3 levels. Ultrasound performed on 10/06/20 showed normal fetal growth. The cervix measured 3.4 cm.   Labs: Hb 11.6, Hct 34.9, PLT 140, WBC 4, electrolytes normal, AST 18, ALT 16. Blood type A positive, Influenza panel negative.  On ultrasound, amniotic fluid is normal and good  fetal activity is seen. Placenta appears normal with no evidence of retroplacental hemorrhage. 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  On transabdominal scan, the cervix appears long and closed.  Our concerns include: Vaginal bleeding -Vaginal bleeding complicates about 70% of pregnancies coexistent with molar pregnancy. I reassured her of normal appearing placenta. Although placental abruption may be challenging to diagnose on ultrasound, it is unlikely she has placental abruption. Patient does not have symptoms of preterm labor. -If vaginal bleeding has decreased significantly or stops, discharge may be considered. Patient was advised to go [facility] if vaginal bleeding recurs. Recurrence of vaginal bleeding is highly likely. -I reassured her that she does not have gestational hypertension/preeclampsia.  We discussed possible complications of molar pregnancy including preeclampsia and hyperthyroidism. I encouraged the patient to undergo FNA of thyroid nodule.  Diabetes Patient is on insulin pump. Sliding scale insulin adjustment may be necessary. Antenatal corticosteroids induce hyperglycemia and additional insulin may be required.  Recommendations: -Discharge home with instruction to go to [facility] if vaginal bleeding recurs. -Recurrence of vaginal bleeding is highly likely.   Thank you for consultation.  If you have any questions, please contact me at the [facility].  Telephone consultation including counseling 30 minutes.

## 2020-10-16 NOTE — Discharge Instructions (Signed)
Vaginal Bleeding During Pregnancy, Second Trimester A small amount of bleeding from the vagina is common during pregnancy. This kind of bleeding is also called spotting. Sometimes the bleeding is normal and is not a sign of problems. In some other cases, it is a sign of something serious. In the second trimester, normal bleeding can happen:  Because of changes in your blood vessels.  When you have sex.  When you have pelvic exams. In the second trimester, some abnormal things can cause bleeding. These include:  Infection or swelling.  Growths in the lowest part of the womb (cervix). These growths are also called polyps.  Problems of the placenta. The placenta can block the opening of the cervix. The placenta can also break away from the womb.  Miscarriage.  Early labor.  The cervix that opens early, before labor.  An egg that was not fertilized properly. This causes a type of pregnancy called molar pregnancy. Tell your doctor right away if there is any bleeding from your vagina. Follow these instructions at home: Watch your bleeding  Watch your condition for any changes. Let your doctor know if you are worried about something.  Try to know what causes your bleeding. Ask yourself these questions: ? Does the bleeding start on its own? ? Does the bleeding start after something is done, such as sex or a pelvic exam?  Use a diary to write down the things you see about your bleeding. Write in your diary: ? If the bleeding flows freely without stopping, or if it starts and stops, and then starts again. ? If the bleeding is heavy or light. ? How many pads you use in a day and how much blood is in them.  Tell your doctor if you pass tissue. He or she may want to see it.   Activity  Follow your doctor's instructions about limiting your activities. Ask what activities are safe for you.  Do not exercise or do activities that take a lot of effort until your doctor says that this is  safe.  Do not have sex until your doctor says that this is safe.  Do not lift anything that is heavier than 10 lb (4.5 kg), or the limit that you are told.  If needed, make plans for someone to help with your normal activities.  General instructions  Do not use tampons.  Do not douche.  Keep all follow-up visits. Contact a doctor if:  You have bleeding in the vagina at any time during pregnancy.  You have cramps.  You have a fever that does not get better with medicine. Get help right away if:  You have very bad cramps in your back or belly (abdomen).  You have contractions.  You have chills.  Your bleeding gets worse.  You pass large clots or a lot of tissue from your vagina.  You feel light-headed.  You feel weak.  You faint.  You are leaking fluid from your vagina.  You have a gush of fluid from your vagina. Summary  A small amount of bleeding during pregnancy is normal. But bleeding can be a sign of something serious. Tell your doctor right away about any bleeding from your vagina.  Try to know what causes your bleeding. Does the bleeding occur on its own, or does it occur after something is done, such as sex or pelvic exams?  Follow your doctor's instructions about what activities you can do.  Keep all follow-up visits. This information is not intended  to replace advice given to you by your health care provider. Make sure you discuss any questions you have with your health care provider. Document Revised: 02/14/2020 Document Reviewed: 02/14/2020 Elsevier Patient Education  2021 [employer].

## 2020-10-17 ENCOUNTER — Encounter (HOSPITAL_COMMUNITY)
Admission: AD | Disposition: A | Discharge status: Home / Self Care | Payer: Self-pay | Source: Home / Self Care | Attending: Obstetrics & Gynecology

## 2020-10-17 ENCOUNTER — Inpatient Hospital Stay (HOSPITAL_COMMUNITY): Payer: Medicaid Other | Admitting: Anesthesiology

## 2020-10-17 ENCOUNTER — Inpatient Hospital Stay (HOSPITAL_COMMUNITY)
Admission: AD | Admit: 2020-10-17 | Discharge: 2020-10-19 | DRG: 805 | Disposition: A | Discharge status: Home / Self Care | Payer: Medicaid Other | Attending: Obstetrics & Gynecology | Admitting: Obstetrics & Gynecology

## 2020-10-17 ENCOUNTER — Encounter (HOSPITAL_COMMUNITY): Payer: Self-pay | Admitting: Obstetrics & Gynecology

## 2020-10-17 ENCOUNTER — Inpatient Hospital Stay (HOSPITAL_COMMUNITY): Payer: Medicaid Other

## 2020-10-17 DIAGNOSIS — Z743 Need for continuous supervision: Secondary | ICD-10-CM | POA: Diagnosis not present

## 2020-10-17 DIAGNOSIS — F909 Attention-deficit hyperactivity disorder, unspecified type: Secondary | ICD-10-CM | POA: Diagnosis not present

## 2020-10-17 DIAGNOSIS — O020 Blighted ovum and nonhydatidiform mole: Principal | ICD-10-CM | POA: Diagnosis present

## 2020-10-17 DIAGNOSIS — Z3A27 27 weeks gestation of pregnancy: Secondary | ICD-10-CM

## 2020-10-17 DIAGNOSIS — O24912 Unspecified diabetes mellitus in pregnancy, second trimester: Secondary | ICD-10-CM

## 2020-10-17 DIAGNOSIS — O2412 Pre-existing diabetes mellitus, type 2, in childbirth: Secondary | ICD-10-CM | POA: Diagnosis present

## 2020-10-17 DIAGNOSIS — N939 Abnormal uterine and vaginal bleeding, unspecified: Secondary | ICD-10-CM | POA: Diagnosis not present

## 2020-10-17 DIAGNOSIS — U071 COVID-19: Secondary | ICD-10-CM | POA: Diagnosis not present

## 2020-10-17 DIAGNOSIS — O9852 Other viral diseases complicating childbirth: Secondary | ICD-10-CM | POA: Diagnosis present

## 2020-10-17 DIAGNOSIS — N852 Hypertrophy of uterus: Secondary | ICD-10-CM | POA: Diagnosis not present

## 2020-10-17 DIAGNOSIS — N858 Other specified noninflammatory disorders of uterus: Secondary | ICD-10-CM | POA: Diagnosis not present

## 2020-10-17 DIAGNOSIS — O30042 Twin pregnancy, dichorionic/diamniotic, second trimester: Secondary | ICD-10-CM | POA: Diagnosis present

## 2020-10-17 DIAGNOSIS — E119 Type 2 diabetes mellitus without complications: Secondary | ICD-10-CM | POA: Diagnosis present

## 2020-10-17 DIAGNOSIS — Z794 Long term (current) use of insulin: Secondary | ICD-10-CM

## 2020-10-17 DIAGNOSIS — F17210 Nicotine dependence, cigarettes, uncomplicated: Secondary | ICD-10-CM | POA: Diagnosis present

## 2020-10-17 DIAGNOSIS — O99334 Smoking (tobacco) complicating childbirth: Secondary | ICD-10-CM | POA: Diagnosis present

## 2020-10-17 DIAGNOSIS — R58 Hemorrhage, not elsewhere classified: Secondary | ICD-10-CM | POA: Diagnosis not present

## 2020-10-17 HISTORY — PX: DILATION AND EVACUATION: SHX1459

## 2020-10-17 LAB — PREPARE FRESH FROZEN PLASMA
Unit division: 0
Unit division: 0
Unit division: 0

## 2020-10-17 LAB — POCT I-STAT EG7
Acid-base deficit: 6 mmol/L — ABNORMAL HIGH (ref 0.0–2.0)
Bicarbonate: 20.9 mmol/L (ref 20.0–28.0)
Calcium, Ion: 1.14 mmol/L — ABNORMAL LOW (ref 1.15–1.40)
HCT: 25 % — ABNORMAL LOW (ref 36.0–46.0)
Hemoglobin: 8.5 g/dL — ABNORMAL LOW (ref 12.0–15.0)
O2 Saturation: 95 %
Potassium: 4.2 mmol/L (ref 3.5–5.1)
Sodium: 140 mmol/L (ref 135–145)
TCO2: 22 mmol/L (ref 22–32)
pCO2, Ven: 49.1 mmHg (ref 44.0–60.0)
pH, Ven: 7.236 — ABNORMAL LOW (ref 7.250–7.430)
pO2, Ven: 92 mmHg — ABNORMAL HIGH (ref 32.0–45.0)

## 2020-10-17 LAB — CBC
HCT: 30.9 % — ABNORMAL LOW (ref 36.0–46.0)
HCT: 28.5 % — ABNORMAL LOW (ref 36.0–46.0)
Hemoglobin: 10.3 g/dL — ABNORMAL LOW (ref 12.0–15.0)
Hemoglobin: 9.4 g/dL — ABNORMAL LOW (ref 12.0–15.0)
MCH: 27.2 pg (ref 26.0–34.0)
MCH: 27.2 pg (ref 26.0–34.0)
MCHC: 33.3 g/dL (ref 30.0–36.0)
MCHC: 33 g/dL (ref 30.0–36.0)
MCV: 81.7 fL (ref 80.0–100.0)
MCV: 82.4 fL (ref 80.0–100.0)
Platelets: 153 10*3/uL (ref 150–400)
Platelets: 145 10*3/uL — ABNORMAL LOW (ref 150–400)
RBC: 3.78 MIL/uL — ABNORMAL LOW (ref 3.87–5.11)
RBC: 3.46 MIL/uL — ABNORMAL LOW (ref 3.87–5.11)
RDW: 13.6 % (ref 11.5–15.5)
RDW: 13.9 % (ref 11.5–15.5)
WBC: 10.3 10*3/uL (ref 4.0–10.5)
WBC: 15.8 10*3/uL — ABNORMAL HIGH (ref 4.0–10.5)
nRBC: 0 % (ref 0.0–0.2)
nRBC: 0 % (ref 0.0–0.2)

## 2020-10-17 LAB — BPAM FFP
Blood Product Expiration Date: 202205172359
Blood Product Expiration Date: 202205172359
Blood Product Expiration Date: 202205302359
Blood Product Expiration Date: 202205312359
ISSUE DATE / TIME: 202205130832
ISSUE DATE / TIME: 202205130832
ISSUE DATE / TIME: 202205130832
ISSUE DATE / TIME: 202205130832
Unit Type and Rh: 6200
Unit Type and Rh: 6200
Unit Type and Rh: 6200
Unit Type and Rh: 6200

## 2020-10-17 LAB — DIC (DISSEMINATED INTRAVASCULAR COAGULATION)PANEL
D-Dimer, Quant: 1.28 ug/mL-FEU — ABNORMAL HIGH (ref 0.00–0.50)
Fibrinogen: 410 mg/dL (ref 210–475)
INR: 0.9 (ref 0.8–1.2)
Platelets: 154 10*3/uL (ref 150–400)
Prothrombin Time: 12.4 seconds (ref 11.4–15.2)
Smear Review: NONE SEEN
aPTT: 25 seconds (ref 24–36)

## 2020-10-17 LAB — GLUCOSE, CAPILLARY
Glucose-Capillary: 119 mg/dL — ABNORMAL HIGH (ref 70–99)
Glucose-Capillary: 97 mg/dL (ref 70–99)
Glucose-Capillary: 170 mg/dL — ABNORMAL HIGH (ref 70–99)

## 2020-10-17 LAB — TYPE AND SCREEN
ABO/RH(D): A POS
Antibody Screen: NEGATIVE

## 2020-10-17 LAB — MASSIVE TRANSFUSION PROTOCOL ORDER (BLOOD BANK NOTIFICATION)

## 2020-10-17 LAB — HCG, QUANTITATIVE, PREGNANCY: hCG, Beta Chain, Quant, S: 441916 m[IU]/mL — ABNORMAL HIGH (ref <5)

## 2020-10-17 LAB — THYROID STIMULATING IMMUNOGLOBULIN: Thyroid Stimulating Immunoglob: <0.1 IU/L (ref 0.00–0.55)

## 2020-10-17 SURGERY — DILATION AND EVACUATION, UTERUS
Anesthesia: General

## 2020-10-17 MED ORDER — SIMETHICONE 80 MG PO CHEW
80.0000 mg | CHEWABLE_TABLET | ORAL | Status: DC | PRN
Start: 2020-10-17 — End: 2020-10-19

## 2020-10-17 MED ORDER — COCONUT OIL OIL: 1.0000 "application " | TOPICAL_OIL | Status: DC | PRN

## 2020-10-17 MED ORDER — DIPHENHYDRAMINE HCL 25 MG PO CAPS: 25.0000 mg | ORAL_CAPSULE | Freq: Four times a day (QID) | ORAL | Status: DC | PRN

## 2020-10-17 MED ORDER — FENTANYL CITRATE (PF) 100 MCG/2ML IJ SOLN
INTRAMUSCULAR | Status: DC | PRN
  Administered 2020-10-17: 50 ug via INTRAVENOUS
  Administered 2020-10-17: 100 ug via INTRAVENOUS
  Administered 2020-10-17: 25 ug via INTRAVENOUS

## 2020-10-17 MED ORDER — SCOPOLAMINE 1 MG/3DAYS TD PT72
MEDICATED_PATCH | TRANSDERMAL | Status: DC | PRN
  Administered 2020-10-17: 1 via TRANSDERMAL

## 2020-10-17 MED ORDER — MIDAZOLAM HCL 2 MG/2ML IJ SOLN
INTRAMUSCULAR | Status: DC | PRN
  Administered 2020-10-17: 2 mg via INTRAVENOUS

## 2020-10-17 MED ORDER — INSULIN PUMP
Freq: Three times a day (TID) | SUBCUTANEOUS | Status: DC
  Administered 2020-10-18: 8.6 via SUBCUTANEOUS
  Administered 2020-10-18: 8.8 via SUBCUTANEOUS
  Administered 2020-10-18: 0.45 via SUBCUTANEOUS
  Administered 2020-10-18: 8 via SUBCUTANEOUS
  Administered 2020-10-19: 8.8 via SUBCUTANEOUS
  Filled 2020-10-17: qty 1

## 2020-10-17 MED ORDER — PROPOFOL 10 MG/ML IV BOLUS
INTRAVENOUS | Status: DC | PRN
  Administered 2020-10-17: 200 mg via INTRAVENOUS

## 2020-10-17 MED ORDER — OXYCODONE HCL 5 MG PO TABS: 10.0000 mg | ORAL_TABLET | ORAL | Status: DC | PRN

## 2020-10-17 MED ORDER — BENZOCAINE-MENTHOL 20-0.5 % EX AERO: 1.0000 "application " | INHALATION_SPRAY | CUTANEOUS | Status: DC | PRN

## 2020-10-17 MED ORDER — DEXAMETHASONE SODIUM PHOSPHATE 10 MG/ML IJ SOLN
INTRAMUSCULAR | Status: DC | PRN
  Administered 2020-10-17: 5 mg via INTRAVENOUS

## 2020-10-17 MED ORDER — ONDANSETRON HCL 4 MG/2ML IJ SOLN
INTRAMUSCULAR | Status: AC
  Filled 2020-10-17: qty 4

## 2020-10-17 MED ORDER — TRANEXAMIC ACID-NACL 1000-0.7 MG/100ML-% IV SOLN
1000.0000 mg | INTRAVENOUS | Status: AC
  Administered 2020-10-17: 1000 mg via INTRAVENOUS

## 2020-10-17 MED ORDER — ONDANSETRON HCL 4 MG/2ML IJ SOLN: 4.0000 mg | INTRAMUSCULAR | Status: DC | PRN

## 2020-10-17 MED ORDER — SOD CITRATE-CITRIC ACID 500-334 MG/5ML PO SOLN
ORAL | Status: AC
  Filled 2020-10-17: qty 15

## 2020-10-17 MED ORDER — SOD CITRATE-CITRIC ACID 500-334 MG/5ML PO SOLN
30.0000 mL | Freq: Once | ORAL | Status: AC
  Administered 2020-10-17: 30 mL via ORAL

## 2020-10-17 MED ORDER — SENNOSIDES-DOCUSATE SODIUM 8.6-50 MG PO TABS
2.0000 | ORAL_TABLET | Freq: Every day | ORAL | Status: DC
  Filled 2020-10-17: qty 2

## 2020-10-17 MED ORDER — FENTANYL CITRATE (PF) 250 MCG/5ML IJ SOLN
INTRAMUSCULAR | Status: AC
  Filled 2020-10-17: qty 5

## 2020-10-17 MED ORDER — ONDANSETRON HCL 4 MG/2ML IJ SOLN
INTRAMUSCULAR | Status: DC | PRN
  Administered 2020-10-17: 4 mg via INTRAVENOUS

## 2020-10-17 MED ORDER — ALBUMIN HUMAN 5 % IV SOLN: INTRAVENOUS | Status: DC | PRN

## 2020-10-17 MED ORDER — ALBUMIN HUMAN 5 % IV SOLN
INTRAVENOUS | Status: AC
  Filled 2020-10-17: qty 250

## 2020-10-17 MED ORDER — ALBUTEROL SULFATE HFA 108 (90 BASE) MCG/ACT IN AERS
INHALATION_SPRAY | RESPIRATORY_TRACT | Status: AC
  Filled 2020-10-17: qty 6.7

## 2020-10-17 MED ORDER — PROPOFOL 10 MG/ML IV BOLUS
INTRAVENOUS | Status: AC
  Filled 2020-10-17: qty 20

## 2020-10-17 MED ORDER — PRENATAL MULTIVITAMIN CH
1.0000 | ORAL_TABLET | Freq: Every day | ORAL | Status: DC
  Administered 2020-10-17 – 2020-10-18 (×2): 1 via ORAL
  Filled 2020-10-17 (×2): qty 1

## 2020-10-17 MED ORDER — DIBUCAINE (PERIANAL) 1 % EX OINT: 1.0000 "application " | TOPICAL_OINTMENT | CUTANEOUS | Status: DC | PRN

## 2020-10-17 MED ORDER — SUGAMMADEX SODIUM 200 MG/2ML IV SOLN
INTRAVENOUS | Status: DC | PRN
  Administered 2020-10-17: 200 mg via INTRAVENOUS

## 2020-10-17 MED ORDER — IBUPROFEN 600 MG PO TABS
600.0000 mg | ORAL_TABLET | Freq: Four times a day (QID) | ORAL | Status: DC
  Administered 2020-10-17 – 2020-10-18 (×5): 600 mg via ORAL
  Filled 2020-10-17 (×5): qty 1

## 2020-10-17 MED ORDER — ZOLPIDEM TARTRATE 5 MG PO TABS: 5.0000 mg | ORAL_TABLET | Freq: Every evening | ORAL | Status: DC | PRN

## 2020-10-17 MED ORDER — ACETAMINOPHEN 325 MG PO TABS: 650.0000 mg | ORAL_TABLET | ORAL | Status: DC | PRN

## 2020-10-17 MED ORDER — TETANUS-DIPHTH-ACELL PERTUSSIS 5-2.5-18.5 LF-MCG/0.5 IM SUSY: 0.5000 mL | PREFILLED_SYRINGE | Freq: Once | INTRAMUSCULAR | Status: DC

## 2020-10-17 MED ORDER — FERROUS SULFATE 325 (65 FE) MG PO TABS
325.0000 mg | ORAL_TABLET | Freq: Every day | ORAL | Status: DC
  Administered 2020-10-18 – 2020-10-19 (×2): 325 mg via ORAL
  Filled 2020-10-17 (×2): qty 1

## 2020-10-17 MED ORDER — ROCURONIUM BROMIDE 100 MG/10ML IV SOLN
INTRAVENOUS | Status: DC | PRN
  Administered 2020-10-17: 20 mg via INTRAVENOUS

## 2020-10-17 MED ORDER — LACTATED RINGERS IV SOLN: INTRAVENOUS | Status: DC

## 2020-10-17 MED ORDER — ONDANSETRON HCL 4 MG/2ML IJ SOLN
INTRAMUSCULAR | Status: AC
  Filled 2020-10-17: qty 2

## 2020-10-17 MED ORDER — LACTATED RINGERS IV SOLN: INTRAVENOUS | Status: DC | PRN

## 2020-10-17 MED ORDER — MISOPROSTOL 200 MCG PO TABS
400.0000 ug | ORAL_TABLET | Freq: Once | ORAL | Status: AC
  Administered 2020-10-17: 400 ug via BUCCAL

## 2020-10-17 MED ORDER — OXYCODONE HCL 5 MG PO TABS: 5.0000 mg | ORAL_TABLET | ORAL | Status: DC | PRN

## 2020-10-17 MED ORDER — MISOPROSTOL 200 MCG PO TABS
ORAL_TABLET | ORAL | Status: AC
  Filled 2020-10-17: qty 2

## 2020-10-17 MED ORDER — DEXAMETHASONE SODIUM PHOSPHATE 10 MG/ML IJ SOLN
INTRAMUSCULAR | Status: AC
  Filled 2020-10-17: qty 1

## 2020-10-17 MED ORDER — WITCH HAZEL-GLYCERIN EX PADS: 1.0000 "application " | MEDICATED_PAD | CUTANEOUS | Status: DC | PRN

## 2020-10-17 MED ORDER — MIDAZOLAM HCL 2 MG/2ML IJ SOLN
INTRAMUSCULAR | Status: AC
  Filled 2020-10-17: qty 2

## 2020-10-17 MED ORDER — PROPOFOL 10 MG/ML IV BOLUS
INTRAVENOUS | Status: AC
  Filled 2020-10-17: qty 40

## 2020-10-17 MED ORDER — ONDANSETRON HCL 4 MG PO TABS: 4.0000 mg | ORAL_TABLET | ORAL | Status: DC | PRN

## 2020-10-17 MED ORDER — SODIUM CHLORIDE 0.9 % IV SOLN
100.0000 mg | Freq: Once | INTRAVENOUS | Status: AC
  Administered 2020-10-17: 100 mg via INTRAVENOUS
  Filled 2020-10-17 (×2): qty 100

## 2020-10-17 MED ORDER — ALBUTEROL SULFATE HFA 108 (90 BASE) MCG/ACT IN AERS
INHALATION_SPRAY | RESPIRATORY_TRACT | Status: DC | PRN
  Administered 2020-10-17 (×2): 2 via RESPIRATORY_TRACT

## 2020-10-17 MED ORDER — SODIUM CHLORIDE 0.9 % IV SOLN: INTRAVENOUS | Status: DC | PRN

## 2020-10-17 SURGICAL SUPPLY — 18 items
CATH ROBINSON RED A/P 16FR (CATHETERS) ×2 IMPLANT
CLOTH BEACON ORANGE TIMEOUT ST (SAFETY) ×2 IMPLANT
DECANTER SPIKE VIAL GLASS SM (MISCELLANEOUS) ×2 IMPLANT
GLOVE BIO SURGEON STRL SZ7 (GLOVE) ×2 IMPLANT
GLOVE BIOGEL PI IND STRL 7.0 (GLOVE) ×2 IMPLANT
GLOVE BIOGEL PI INDICATOR 7.0 (GLOVE) ×2
GOWN STRL REUS W/TWL LRG LVL3 (GOWN DISPOSABLE) ×4 IMPLANT
KIT BERKELEY 1ST TRIMESTER 3/8 (MISCELLANEOUS) ×2 IMPLANT
NS IRRIG 1000ML POUR BTL (IV SOLUTION) ×2 IMPLANT
PACK VAGINAL MINOR WOMEN LF (CUSTOM PROCEDURE TRAY) ×2 IMPLANT
PAD OB MATERNITY 4.3X12.25 (PERSONAL CARE ITEMS) ×2 IMPLANT
PAD PREP 24X48 CUFFED NSTRL (MISCELLANEOUS) ×2 IMPLANT
SET BERKELEY SUCTION TUBING (SUCTIONS) ×2 IMPLANT
TOWEL OR 17X24 6PK STRL BLUE (TOWEL DISPOSABLE) ×4 IMPLANT
VACURETTE 10 RIGID CVD (CANNULA) IMPLANT
VACURETTE 7MM CVD STRL WRAP (CANNULA) IMPLANT
VACURETTE 8 RIGID CVD (CANNULA) IMPLANT
VACURETTE 9 RIGID CVD (CANNULA) IMPLANT

## 2020-10-17 NOTE — Lactation Note (Signed)
This note was copied from a baby's chart. Lactation Consultation Note  Patient Name: Mindy Bradford [id number] Date: 10/17/2020 Reason for consult: Initial assessment;NICU baby;Maternal endocrine disorder;Mother's request Age:35 hours   Mom working with RN on arrival to get her medication. DEBP set up and reviewed by RN. LC inquired if Mom's flange size was okay and if she wanted me to check it. Mom declined and stated pumping going well q 3 hrs getting drops of colostrum. Mom to alert RN to transport EBM to the NICU.  LC provided NICU brochure and encouraged Mom to take it to the NICU with her to ask staff about any questions she might have.  LC also provided [facility] brochure and reviewed both inpatient and outpatient services.   All questions answered at the end of the visit.   Maternal Data    Feeding Mother's Current Feeding Choice: Breast Milk  LATCH Score                    Lactation Tools Discussed/Used Tools: Pump;Flanges;Other (comment) (flanges assessed by RN. Mom did not want me to recheck her flange size) Breast pump type: Double-Electric Breast Pump (Set up and reviewed by RN on the floor) Pump Education:  (Done by RN) Reason for Pumping: increase stimulation Pumping frequency: every 3 hrs for 15 minutes  Interventions Interventions: Breast feeding basics reviewed;Education;DEBP;Coconut oil  Discharge    Consult Status Consult Status: Follow-up Date: 10/18/20 Follow-up type: In-patient    Kalisa Girtman  Nicholson-Springer 10/17/2020, 7:09 PM

## 2020-10-17 NOTE — Brief Op Note (Signed)
10/17/2020  10:07 AM  PATIENT:  Mindy Bradford  35 y.o. female  PRE-OPERATIVE DIAGNOSIS:  Twin di/di pregnancy with molar pregnancy of second twin; retained POC after delivery of twin A  POST-OPERATIVE DIAGNOSIS:  Same  PROCEDURE:  Procedure(s): DILATATION AND EVACUATION (N/A)  SURGEON:  Surgeon(s) and Role:    * Lesly Dukes, MD - Primary  ANESTHESIA:   general  EBL:  2700 mL   BLOOD ADMINISTERED:2 CC PRBC  DRAINS: none   LOCAL MEDICATIONS USED:  NONE  SPECIMEN:  Source of Specimen:  endometrial curretings  DISPOSITION OF SPECIMEN:  PATHOLOGY  COUNTS:  YES  TOURNIQUET:  * No tourniquets in log *  DICTATION: .Note written in EPIC  PLAN OF CARE: Admit to inpatient   PATIENT DISPOSITION:  PACU - hemodynamically stable.   Delay start of Pharmacological VTE agent (>24hrs) due to surgical blood loss or risk of bleeding: yes

## 2020-10-17 NOTE — Progress Notes (Signed)
Late entry-from approx 0820  MAU called to say pt had delivered a 27wk baby at home.  NICU made aware and came down to L&D to prepare.  When pt arrives heavy bleeding is noted.  Infant stabilized and taken to NICU.  Dr Penne Lash called to come to room for bleeding.  After reviewing pts chart, it is noted that she was positive covid yesterday and was pregnant with di/di twins with the second being a molar pregnancy.  Tritan used to determine QBL and pt has lost approx 2000cc that we can account for. PPH called. Cytotec given, 2nd iv began, anesthesia and OR notified.  Dr Penne Lash calls for bedside ultrasound to examine whats left in the uterus.  TXA given.  Pt is currently stable, vital signs WNL.  ULtrasound notes blood remains in the uterus.  Decision made to go to the OR.  Anesthesia wants to call MTP, that is initiated.  Dr Penne Lash performs Bimanual exam-QBL now up to approx 2500 here, but pt reports losing "a lot" at home as well.  Pt taken to OR  At [phone]

## 2020-10-17 NOTE — Progress Notes (Addendum)
Inpatient Diabetes Program Recommendations  AACE/ADA: New Consensus Statement on Inpatient Glycemic Control (2015)  Target Ranges:  Prepandial:   less than 140 mg/dL      Peak postprandial:   less than 180 mg/dL (1-2 hours)      Critically ill patients:  140 - 180 mg/dL   Results for Mindy Bradford, Mindy Bradford (MRN [id number]) as of 10/17/2020 13:43  Ref. Range 10/17/2020 10:32 10/17/2020 13:20  Glucose-Capillary Latest Ref Range: 70 - 99 mg/dL 884 (H) 75    35 y.o. female presenting for precipitous delivery of Twin A at home with molar twin pregnancy.  Pt was transported by EMS with bleeding extrusion of some molar placental tissue.  Twin A  Placenta was reported to have delivered intact by EMS.  Pt was being followed by [facility] MFM and oncology for the molar pregnancy  Pre-existing DM prior to Pregnancy   Home DM Meds: Omni Pod Insulin Pump Basal rate= 1.85 units/hr ICR= 5 ISF= 25   Current Orders: Insulin Pump   Endocrinologist: Dr. Smith Robert with [facility] endocrinology   CBGs stable so far following admission  Note Insulin Pump orders placed this afternoon  Will follow     --Will follow patient during hospitalization--  Ambrose Finland RN, MSN, CDE Diabetes Coordinator Inpatient Glycemic Control Team Team Pager: [phone] (8a-5p)

## 2020-10-17 NOTE — Op Note (Signed)
Vertell Limber PROCEDURE DATE: 10/17/2020  PREOPERATIVE DIAGNOSIS: 35 yo female with di di twins, suspected molar pregnancy of Twin B, and retained products of conception after vaginal delivery of twin A  POSTOPERATIVE DIAGNOSIS: 35 yo female with di di twins, molar pregnancy of Twin B, and retained products of conception after vaginal delivery of twin A  PROCEDURE:     Dilation and Evacuation.  SURGEON:  Dr. Elsie Lincoln  INDICATIONS: 35 y.o. [phone] with Mercie Eon twins, suspected molar pregnancy of Twin B, and retained products of conception after vaginal delivery of twin A .  Risks of surgery were discussed with the patient including but not limited to: bleeding which may require transfusion; infection which may require antibiotics; injury to uterus or surrounding organs;need for additional procedures including laparotomy or laparoscopy; possibility of intrauterine scarring which may impair future fertility; and other postoperative/anesthesia complications. Written informed consent was obtained.    FINDINGS:  A 18-week sized uterus, large amounts of products of conception, specimen sent to pathology.  ANESTHESIA:  General  ESTIMATED BLOOD LOSS:  500 intra op (see RN notes for total blood loss from admission)  SPECIMENS:  Products of conception sent to pathology  COMPLICATIONS:  None immediate.  PROCEDURE DETAILS:  The patient received intravenous antibiotics in the operating room.  General anesthesia was administered and was found to be adequate.  After an adequate timeout was performed, she was placed in the dorsal lithotomy position and examined; then prepped and draped in the sterile manner.   HA foley catheter was placed in the bladder. A vaginal speculum was then placed in the patient's vagina and a ring forcep was applied to the anterior lip of the cervix. The cervix was already several centimeters dilated and a 12 mm suction curette that was gently advanced to the uterine fundus.  The  suction device was then activated and curette slowly rotated to clear the uterus of products of conception.  A sharp curettage was then performed to confirm complete emptying of the uterus. Bimanual was performed and the uterine walls were palpated and no residual products of conception were felt.  There was minimal bleeding noted and the tenaculum removed with good hemostasis noted.   All instruments were removed from the patient's vagina. The patient tolerated the procedure well and was taken to the recovery area awake, and in stable condition.  Pt received 2 units PRBC in the operating room.

## 2020-10-17 NOTE — Transfer of Care (Signed)
Immediate Anesthesia Transfer of Care Note  Patient: Mindy Bradford  Procedure(s) Performed: DILATATION AND EVACUATION (N/A )  Patient Location: PACU  Anesthesia Type:General  Level of Consciousness: awake and alert   Airway & Oxygen Therapy: Patient Spontanous Breathing  Post-op Assessment: Report given to RN  Post vital signs: Reviewed  Last Vitals:  Vitals Value Taken Time  BP    Temp    Pulse 130 10/17/20 1026  Resp 21 10/17/20 1026  SpO2 94 % 10/17/20 1026  Vitals shown include unvalidated device data.  Last Pain: There were no vitals filed for this visit.       Complications: No complications documented.

## 2020-10-17 NOTE — Anesthesia Preprocedure Evaluation (Signed)
Anesthesia Evaluation  Patient identified by MRN, date of birth, ID band Patient awake    Reviewed: Allergy & Precautions, NPO status , Patient's Chart, lab work & pertinent test resultsPreop documentation limited or incomplete due to emergent nature of procedure.  Airway Mallampati: II  TM Distance: >3 FB     Dental  (+) Dental Advisory Given   Pulmonary Current Smoker,    breath sounds clear to auscultation       Cardiovascular negative cardio ROS   Rhythm:Regular Rate:Normal     Neuro/Psych negative neurological ROS     GI/Hepatic negative GI ROS, Neg liver ROS,   Endo/Other  diabetes  Renal/GU negative Renal ROS     Musculoskeletal   Abdominal   Peds  Hematology Code hemorrhage. MTP initiated.   Anesthesia Other Findings   Reproductive/Obstetrics                             Anesthesia Physical Anesthesia Plan  ASA: IV and emergent  Anesthesia Plan: General   Post-op Pain Management:    Induction: Intravenous, Rapid sequence and Cricoid pressure planned  PONV Risk Score and Plan: 3 and Dexamethasone, Ondansetron, Midazolam, Scopolamine patch - Pre-op and Treatment may vary due to age or medical condition  Airway Management Planned: Oral ETT and Video Laryngoscope Planned  Additional Equipment:   Intra-op Plan:   Post-operative Plan: Extubation in OR  Informed Consent: I have reviewed the patients History and Physical, chart, labs and discussed the procedure including the risks, benefits and alternatives for the proposed anesthesia with the patient or authorized representative who has indicated his/her understanding and acceptance.     Dental advisory given  Plan Discussed with: CRNA  Anesthesia Plan Comments:         Anesthesia Quick Evaluation

## 2020-10-17 NOTE — Progress Notes (Addendum)
MEDICATION RELATED CONSULT NOTE - INITIAL   Pharmacy Consult for insulin pump recommendations postpartum Indication: T2DM complicated by pregnancy  Allergies  Allergen Reactions  . Sulfa Antibiotics Rash    Vital Signs: Temp: 99.6 F (37.6 C) (05/13 1130) Temp Source: Oral (05/13 1130) BP: 117/61 (05/13 1245) Pulse Rate: 102 (05/13 1245)  Labs: CBG (last 3)  Recent Labs    10/16/20 1502 10/17/20 1032 10/17/20 1320  GLUCAP 192* 119* 97    Microbiology:  Medical History: Past Medical History:  Diagnosis Date  . 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 DM2 (diabetes mellitus, type 2) (HCC)   . Vaginal Pap smear, abnormal     Medications:  Omnipod insulin pump Routine postpartum meds  Assessment: Patient with T2DM complicated by pregnancy. She is currently PPD#0. She was started on Omnipod insulin pump this pregnancy in 05/2020. She was discharged last pregnancy (01/2020) on NPH 12 units BID and Novolog 8 units TIDWM. Per last office visit with endocrinology at [facility] (10/13/20) insulin pump settings were basal 1.85 units/hr, ISF 25, and ICR 1:5.   Goal of Therapy:  Target Ranges:  Prepandial:   less than 140 mg/dL      Peak postprandial:   less than 180 mg/dL (1-2 hours)      Critically ill patients:  140 - 180 mg/dL  Plan:  As patient was diagnosed with T2DM during previous pregnancy and does not have an established insulin regimen, Total Daily Dose needs are estimated to be around 40 units based on previous postpartum regimen needed. Basal rate should be 50% of TDD = 0.8 units/hr. Bolus doses will be ~6.5 units TIDWM.   Thank you for allowing pharmacy to be a part of this patient's care.  Benetta Spar Maddyson Keil 10/17/2020,1:41 PM

## 2020-10-17 NOTE — Progress Notes (Signed)
Patient ID: Mindy Bradford, female   DOB: 04-25-1986, 35 y.o.   MRN: [id number]  Late note entrance:  MAU Received a call from EMS stating they were bringing in a 27 weeker who delivered in the field. Information MAU received was that the patient and the infant were both stable. Vitals were stable.   MAU staff informed NICU team immediately Sharen Counter CNM notified Dr. Vergie Living on labor and delivery.  EMS was informed to take the elevator to MAU for the MAU provider to MSE the patient prior to transfer to labor and delivery.   Patient was MSE'd by myself in the elevator. I walked with the EMS team and the patient to labor and delivery where the NICU team and labor and delivery team was waiting for patient's arrival. The patient was calm, conversing appropriately, answering questions appropriately and holding her infant.    Duane Lope, NP 10/17/2020 10:48 AM

## 2020-10-17 NOTE — Anesthesia Postprocedure Evaluation (Signed)
Anesthesia Post Note  Patient: AZARA GEMME  Procedure(s) Performed: DILATATION AND EVACUATION (N/A )     Patient location during evaluation: PACU Anesthesia Type: General Level of consciousness: awake and alert Pain management: pain level controlled Vital Signs Assessment: post-procedure vital signs reviewed and stable Respiratory status: spontaneous breathing, nonlabored ventilation, respiratory function stable and patient connected to nasal cannula oxygen Cardiovascular status: blood pressure returned to baseline and stable Postop Assessment: no apparent nausea or vomiting Anesthetic complications: no   No complications documented.  Last Vitals:  Vitals:   10/17/20 1115 10/17/20 1130  BP: (!) 126/58 123/66  Pulse: (!) 125 (!) 103  Resp: (!) 30 17  Temp:  37.6 C  SpO2: 98% 99%    Last Pain:  Vitals:   10/17/20 1130  TempSrc: Oral  PainSc: 3    Pain Goal: Patients Stated Pain Goal: 3 (10/17/20 1130)                 Kennieth Rad

## 2020-10-17 NOTE — H&P (Signed)
Mindy Bradford is a 35 y.o. female presenting for precipitous delivery of Twin A at home with molar twin pregnancy.  Pt was transported by EMS with bleeding extrusion of some molar placental tissue.  Twin A  Placenta was reported to have delivered intact by EMS.  Pt was being followed by [facility] and oncology for the molar pregnancy.  She has had a recent negative CXR 09/26/20.  She was most recently discharged from [facility] yesterday after an episode of vaginal bleeding.  Pt went into labor and delivered precipitously.  Pt has type 2 DM on omnipod.  OB History    Gravida  4   Para  3   Term  2   Preterm  1   AB      Living  3     SAB      IAB      Ectopic      Multiple  0   Live Births  3          Past Medical History:  Diagnosis Date  . 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 DM2 (diabetes mellitus, type 2) (HCC)   . Vaginal Pap smear, abnormal    Past Surgical History:  Procedure Laterality Date  . CHOLECYSTECTOMY  2008  . LEEP  5 years ago   normal results  . WISDOM TOOTH EXTRACTION     age 23   Family History: family history includes ADD / ADHD in her brother and sister; Asthma in her mother and sister; Cancer in an other family member; Diabetes in an other family member; Heart disease in an other family member; Hyperlipidemia in an other family member; Hypertension in an other family member. Social History:  reports that she has been smoking cigarettes. She has a 2.00 pack-year smoking history. She has never used smokeless tobacco. She reports that she does not drink alcohol and does not use drugs.     Maternal Diabetes: Yes:  Diabetes Type:  Insulin/Medication controlled Genetic Screening: abnormal  Maternal Ultrasounds/Referrals: Abnormal (didi twins: 1 normal fetus+complete molar pregnancy)  Fetal Ultrasounds or other Referrals:  Referred to Materal Fetal Medicine  Maternal Substance Abuse:  Tobacco (1/2 ppd) Significant  Maternal Medications: insulin pump Significant Maternal Lab Results: GBS unknown, hyperthyroid (maternal thyroid nodule)  Review of Systems  Constitutional: Positive for fatigue and fever.  HENT: Positive for congestion and sinus pressure.   Respiratory: Positive for cough.   Cardiovascular: Negative.   Gastrointestinal: Negative.   Genitourinary: Positive for vaginal bleeding.  Musculoskeletal: Negative.   Neurological: Negative.   Psychiatric/Behavioral: Negative.    History   Blood pressure 123/66, pulse (!) 103, temperature 99.6 F (37.6 C), temperature source Oral, resp. rate 17, last menstrual period 04/11/2020, SpO2 99 %, unknown if currently breastfeeding. Exam Physical Exam Vitals reviewed.  Constitutional:      General: She is not in acute distress.    Appearance: She is well-developed.  HENT:     Head: Normocephalic and atraumatic.  Eyes:     Conjunctiva/sclera: Conjunctivae normal.  Neck:     Thyroid: No thyromegaly.  Cardiovascular:     Rate and Rhythm: Normal rate and regular rhythm.  Pulmonary:     Effort: Pulmonary effort is normal.     Breath sounds: Normal breath sounds.  Abdominal:     Palpations: Abdomen is soft.     Tenderness: There is no abdominal tenderness. There is no guarding or rebound.  Genitourinary:  Comments: Tanner V Large amunt of blood in vagina and LUS Quant blood loss approx 2700; chuncks of molar tissue was in blood and weighted. Musculoskeletal:        General: No tenderness. Normal range of motion.     Cervical back: Neck supple.  Skin:    General: Skin is warm and dry.  Neurological:     Mental Status: She is alert and oriented to person, place, and time.     Prenatal labs: ABO, Rh: --/--/A POS (05/13 9735) Antibody: NEG (05/13 0841) Rubella:   RPR: NON REACTIVE (08/25 1324)  HBsAg:    HIV:    GBS:     CRITICAL CARE Performed by: Elsie Lincoln   Total critical care time: 35 minutes (08:25-09:05)  Critical  care time was exclusive of separately billable procedures and treating other patients.  Critical care was necessary to treat or prevent imminent or life-threatening deterioration.  Critical care was time spent personally by me on the following activities: development of treatment plan with patient and/or surrogate as well as nursing, discussions with consultants, evaluation of patient's response to treatment, examination of patient, obtaining history from patient or surrogate, ordering and performing treatments and interventions, ordering and review of laboratory studies, ordering and review of radiographic studies, pulse oximetry and re-evaluation of patient's condition.  Assessment/Plan: 35 yo G4P2013 at [redacted] weeks EGA with a twin molar pregnancy s/p vaginal delivery and retained POC.  1.  Bedside US completed and placental tissue noted  2.  MTP called.  400 mcg buccal cytotec, pitocin, and TXA given.  Pt crossed for blood.  Coags sent.   3.  Pt consented for suction D & E   Pt consented for D & E.  Risks include but not limited to bleeding, infection, and damage to intraabdominal organs.  Very rare chance of laparotomy in the case of damage to surrounding organs.  Risk of anesthesia dn DVT  / PE also explained.  4.  Dr. Sampson Goon, anesthesia, at bedside and managing hemodynamic status. 5.  Doxcycline ordered for pre op antibiotics.  Elsie Lincoln 10/17/2020, 12:47 PM

## 2020-10-17 NOTE — Anesthesia Procedure Notes (Addendum)
Procedure Name: Intubation Date/Time: 10/17/2020 10:23 AM Performed by: Angela Adam, CRNA Pre-anesthesia Checklist: Patient identified, Emergency Drugs available, Suction available, Patient being monitored and Timeout performed Patient Re-evaluated:Patient Re-evaluated prior to induction Oxygen Delivery Method: Circle system utilized Induction Type: IV induction, Rapid sequence and Cricoid Pressure applied Laryngoscope Size: Glidescope and 3 Grade View: Grade I Tube type: Oral Tube size: 7.0 mm Number of attempts: 1 Airway Equipment and Method: Video-laryngoscopy and Stylet Placement Confirmation: ETT inserted through vocal cords under direct vision Secured at: 21 cm Tube secured with: Tape Dental Injury: Teeth and Oropharynx as per pre-operative assessment  Comments: Possible Bronchospasm relieved by albuterol and sevo

## 2020-10-18 ENCOUNTER — Other Ambulatory Visit: Payer: Self-pay

## 2020-10-18 ENCOUNTER — Encounter (HOSPITAL_COMMUNITY): Payer: Self-pay | Admitting: Obstetrics & Gynecology

## 2020-10-18 LAB — CBC
HCT: 23.6 % — ABNORMAL LOW (ref 36.0–46.0)
Hemoglobin: 7.8 g/dL — ABNORMAL LOW (ref 12.0–15.0)
MCH: 27.3 pg (ref 26.0–34.0)
MCHC: 33.1 g/dL (ref 30.0–36.0)
MCV: 82.5 fL (ref 80.0–100.0)
Platelets: 92 10*3/uL — ABNORMAL LOW (ref 150–400)
RBC: 2.86 MIL/uL — ABNORMAL LOW (ref 3.87–5.11)
RDW: 14.1 % (ref 11.5–15.5)
WBC: 8 10*3/uL (ref 4.0–10.5)
nRBC: 0 % (ref 0.0–0.2)

## 2020-10-18 LAB — CULTURE, BETA STREP (GROUP B ONLY)

## 2020-10-18 NOTE — Lactation Note (Signed)
Lactation Consultation Note  Patient Name: Mindy BradfordF Date: 10/18/2020 Reason for consult: Follow-up assessment;NICU baby;Preterm <34wks Age:35 y.o.   Mom awake and in bed.  She states she feels fine.  Covid +.  Mom denies having any questions regarding the pump.  She is not able to collect any using the pump but says she can get colostrum out with hand expression.  She is familiar with this due to her previous child being in NICU.  Mom states pumping is comfortable and has no concerns or questions.  LC drew 8 boxes on the whiteboard and encouraged her to mark off each time she pumped.    Plan: pump every 2-3 hours with a 4 hour stretch at night.  Hand express after pumping.  Collect any drops to take up to NICU for baby girl.    Maternal Data Does the patient have breastfeeding experience prior to this delivery?: Yes How long did the patient breastfeed?: She hand expressed and pumped because her previous child, (now 8 months), was also in NICU  Feeding Mother's Current Feeding Choice: Breast Milk  LATCH Score                    Lactation Tools Discussed/Used Tools: Pump Breast pump type: Double-Electric Breast Pump Reason for Pumping: NICU infant, stimlulate milk supply  Interventions    Discharge Pump: DEBP  Consult Status Consult Status: Follow-up Date: 10/19/20 Follow-up type: In-patient    Maryruth [facility] 10/18/2020, 8:56 AM

## 2020-10-18 NOTE — Progress Notes (Signed)
POSTPARTUM PROGRESS NOTE  POD #1  Subjective:  MARTISHA TOULOUSE is a 35 y.o. [phone] s/p NSVD and D&E due to twin pregnancy with molar. Today she notes no acute complaints. She denies any problems with ambulating, voiding or po intake. Denies nausea or vomiting. She has + flatus, +BM.  Pain is minimal.  Lochia moderate Denies fever/chills/chest pain/SOB.  no HA, no blurry vision, no RUQ pain.  Overall doing well  Objective: Blood pressure (!) 119/59, pulse 94, temperature 97.7 F (36.5 C), temperature source Oral, resp. rate 18, last menstrual period 04/11/2020, SpO2 100 %, unknown if currently breastfeeding.  Physical Exam:  General: alert, cooperative and no distress Chest: no respiratory distress Heart: regular rate and rhythm Abdomen: soft, nontender Uterine Fundus: firm, appropriately tender DVT Evaluation: No calf swelling or tenderness Extremities: no edema Skin: warm, dry  CBC     Status: Abnormal   Collection Time: 10/17/20  8:54 AM  Result Value Ref Range   WBC 10.3 4.0 - 10.5 K/uL   RBC 3.78 (L) 3.87 - 5.11 MIL/uL   Hemoglobin 10.3 (L) 12.0 - 15.0 g/dL   HCT 03.4 (L) 74.2 - 59.5 %   MCV 81.7 80.0 - 100.0 fL   MCH 27.2 26.0 - 34.0 pg   MCHC 33.3 30.0 - 36.0 g/dL   RDW 63.8 75.6 - 43.3 %   Platelets 153 150 - 400 K/uL   nRBC 0.0 0.0 - 0.2 %  POCT I-Stat EG7     Status: Abnormal   Collection Time: 10/17/20  9:42 AM  Result Value Ref Range   pH, Ven 7.236 (L) 7.250 - 7.430   pCO2, Ven 49.1 44.0 - 60.0 mmHg   pO2, Ven 92.0 (H) 32.0 - 45.0 mmHg   Bicarbonate 20.9 20.0 - 28.0 mmol/L   TCO2 22 22 - 32 mmol/L   O2 Saturation 95.0 %   Acid-base deficit 6.0 (H) 0.0 - 2.0 mmol/L   Sodium 140 135 - 145 mmol/L   Potassium 4.2 3.5 - 5.1 mmol/L   Calcium, Ion 1.14 (L) 1.15 - 1.40 mmol/L   HCT 25.0 (L) 36.0 - 46.0 %   Hemoglobin 8.5 (L) 12.0 - 15.0 g/dL   Sample type VENOUS   Glucose, capillary     Status: Abnormal   Collection Time: 10/17/20 10:32 AM  Result Value Ref  Range   Glucose-Capillary 119 (H) 70 - 99 mg/dL  Glucose, capillary     Status: None   Collection Time: 10/17/20  1:20 PM  Result Value Ref Range   Glucose-Capillary 97 70 - 99 mg/dL  CBC     Status: Abnormal   Collection Time: 10/17/20  2:03 PM  Result Value Ref Range   WBC 15.8 (H) 4.0 - 10.5 K/uL   RBC 3.46 (L) 3.87 - 5.11 MIL/uL   Hemoglobin 9.4 (L) 12.0 - 15.0 g/dL   HCT 29.5 (L) 18.8 - 41.6 %   MCV 82.4 80.0 - 100.0 fL   MCH 27.2 26.0 - 34.0 pg   MCHC 33.0 30.0 - 36.0 g/dL   RDW 60.6 30.1 - 60.1 %   Platelets 145 (L) 150 - 400 K/uL   nRBC 0.0 0.0 - 0.2 %  hCG, quantitative, pregnancy     Status: Abnormal   Collection Time: 10/17/20  2:03 PM  Result Value Ref Range   hCG, Beta Chain, Quant, S 441,916 (H) <5 mIU/mL  Glucose, capillary     Status: Abnormal   Collection Time: 10/17/20  4:08 PM  Result Value Ref Range   Glucose-Capillary 170 (H) 70 - 99 mg/dL  CBC     Status: Abnormal   Collection Time: 10/18/20  4:07 AM  Result Value Ref Range   WBC 8.0 4.0 - 10.5 K/uL   RBC 2.86 (L) 3.87 - 5.11 MIL/uL   Hemoglobin 7.8 (L) 12.0 - 15.0 g/dL   HCT 81.1 (L) 91.4 - 78.2 %   MCV 82.5 80.0 - 100.0 fL   MCH 27.3 26.0 - 34.0 pg   MCHC 33.1 30.0 - 36.0 g/dL   RDW 95.6 21.3 - 08.6 %   Platelets 92 (L) 150 - 400 K/uL   nRBC 0.0 0.0 - 0.2 %    Assessment/Plan: ANIYAH NOBIS is a 35 y.o. [phone] s/p NSVD (at home) and D&C due to twin pregnancy complicated by molar at [redacted]w[redacted]d POD#1 complicated by: 1) PPH due to molar pregnancy- s/p D&E, 2upRBCs -Hgb as above, VSS, pt asymptomatic -plan for oral iron -pain well controlled -voiding freely -meeting postpartum/postop milestones appropriately  Contraception: natural planning Feeding: NICU  Dispo: Continue postpartum care, plan for discharge home on PPD#2   LOS: 1 day   Myna Hidalgo, DO Faculty Attending, [facility] 10/18/2020, 7:29 AM

## 2020-10-18 NOTE — Discharge Summary (Signed)
Physician Discharge Summary  Patient ID: Mindy Bradford MRN: [id number] DOB/AGE: 35-05-1986 35 y.o.  Admit date: 10/16/2020 Discharge date: 10/16/2020  Admission Diagnoses: Vaginal bleeding in pregnancy Di/di twin pregnancy with molar pregnancy Type 2 Diabetes Tobacco use H/o PPROM Short interval pregnancy -Thyroid nodule  Discharge Diagnoses:  Active Problems:   Vaginal bleeding in pregnancy, third trimester Di/di twin pregnancy with molar pregnancy Type 2 Diabetes Tobacco use COVID+ H/o PPROM Short interval pregnancy -Thyroid nodule  Discharged Condition: stable  Hospital Course: 35 y.o. female [phone] with IUP at [redacted]w[redacted]d presenting for concern of vaginal bleeding that started on 5/11 at 2350. Pt reports that the onset of bleeding occurred without any known triggers.  She was treated with IV fluids and BMZ for fetal lung maturity.  MFM consulted- see separate note.  Initial BP was elevated, labs were drawn, PC ratio was elevated; however likely contaminated due to blood.  BP improved, did not meet criteria for preeclampsia.  She did not show signs of labor and bleeding had improved.  Per MFM recommendations, plan was for discharge home with close outpatient follow up.  Consults: MFM  Significant Diagnostic Studies: labs: Results for Mindy, Bradford (MRN [id number]) as of 10/18/2020 01:40  Ref. Range 10/16/2020 04:10  Sodium Latest Ref Range: 135 - 145 mmol/L 135  Potassium Latest Ref Range: 3.5 - 5.1 mmol/L 4.2  Chloride Latest Ref Range: 98 - 111 mmol/L 107  CO2 Latest Ref Range: 22 - 32 mmol/L 20 (L)  Glucose Latest Ref Range: 70 - 99 mg/dL 683 (H)  BUN Latest Ref Range: 6 - 20 mg/dL <5 (L)  Creatinine Latest Ref Range: 0.44 - 1.00 mg/dL 4.19 (L)  Calcium Latest Ref Range: 8.9 - 10.3 mg/dL 8.5 (L)  Anion gap Latest Ref Range: 5 - 15  8  Alkaline Phosphatase Latest Ref Range: 38 - 126 U/L 81  Albumin Latest Ref Range: 3.5 - 5.0 g/dL 2.3 (L)  AST Latest Ref Range: 15 - 41  U/L 18  ALT Latest Ref Range: 0 - 44 U/L 16  Total Protein Latest Ref Range: 6.5 - 8.1 g/dL 5.6 (L)  Total Bilirubin Latest Ref Range: 0.3 - 1.2 mg/dL 0.5  GFR, Estimated Latest Ref Range: >60 mL/min >60  WBC Latest Ref Range: 4.0 - 10.5 K/uL 4.0  RBC Latest Ref Range: 3.87 - 5.11 MIL/uL 4.29  Hemoglobin Latest Ref Range: 12.0 - 15.0 g/dL 62.2 (L)  HCT Latest Ref Range: 36.0 - 46.0 % 34.9 (L)  MCV Latest Ref Range: 80.0 - 100.0 fL 81.4  MCH Latest Ref Range: 26.0 - 34.0 pg 27.0  MCHC Latest Ref Range: 30.0 - 36.0 g/dL 29.7  RDW Latest Ref Range: 11.5 - 15.5 % 13.5  Platelets Latest Ref Range: 150 - 400 K/uL 140 (L)  nRBC Latest Ref Range: 0.0 - 0.2 % 0.0   Treatments: IV hydration and betamethasone  Discharge Exam: Blood pressure 136/70, pulse 80, temperature 97.8 F (36.6 C), temperature source Oral, resp. rate 16, height 5\' 7"  (1.702 m), weight 108.9 kg, last menstrual period 04/11/2020, SpO2 98 %, unknown if currently breastfeeding. General appearance - alert, well appearing, and in no distress Mental status - normal mood, behavior, speech, dress, motor activity, and thought processes Abdomen - gravid, non-tender Extremities - no pedal edema noted Skin - warm and dry  Disposition: Discharge disposition: 01-Home or Self Care        Allergies as of 10/16/2020      Reactions   Sulfa  Antibiotics Rash      Medication List    TAKE these medications   aspirin EC 81 MG tablet Take 81 mg by mouth daily. Swallow whole.   aspirin-acetaminophen-caffeine 250-250-65 MG tablet Commonly known as: EXCEDRIN MIGRAINE Take 1 tablet by mouth every 6 (six) hours as needed for headache.   Dexcom G6 Sensor Misc by Does not apply route.   insulin aspart 100 UNIT/ML injection Commonly known as: novoLOG Inject 6 Units into the skin 3 (three) times daily with meals.   insulin glargine 100 UNIT/ML injection Commonly known as: LANTUS Inject 44 Units into the skin daily.   insulin NPH  Human 100 UNIT/ML injection Commonly known as: NOVOLIN N Inject 0.12 mLs (12 Units total) into the skin at bedtime.   insulin NPH Human 100 UNIT/ML injection Commonly known as: NOVOLIN N Inject 0.1 mLs (10 Units total) into the skin daily before breakfast.   oxyCODONE 5 MG immediate release tablet Commonly known as: Oxy IR/ROXICODONE Take 1 tablet (5 mg total) by mouth every 4 (four) hours as needed (pain scale 4-7).   prenatal multivitamin Tabs tablet Take 1 tablet by mouth daily at 12 noon.       Follow-up Information    [facility] Follow up.   Why: Follow up as scheduled tomorrow with your [facility] physician Contact information: [facility] [place] [address] [phone]               Signed: Sharon Seller 10/18/2020, 1:37 AM

## 2020-10-19 LAB — HCG, QUANTITATIVE, PREGNANCY: hCG, Beta Chain, Quant, S: 57231 m[IU]/mL — ABNORMAL HIGH (ref <5)

## 2020-10-19 MED ORDER — INSULIN PUMP: SUBCUTANEOUS | 11 refills | Status: DC

## 2020-10-19 MED ORDER — IBUPROFEN 600 MG PO TABS: 600.0000 mg | ORAL_TABLET | Freq: Four times a day (QID) | ORAL | 0 refills | Status: DC

## 2020-10-19 NOTE — Lactation Note (Incomplete)
This note was copied from a baby's chart. Lactation Consultation Note  Patient Name: Girl Sharon Rubis [id number] Date: 10/19/2020 Reason for consult: Follow-up assessment;NICU baby Age:35 hours  Maternal Data Has patient been taught Hand Expression?: Yes Does the patient have breastfeeding experience prior to this delivery?: Yes  Feeding Mother's Current Feeding Choice: Breast Milk and Donor Milk  LATCH Score                    Lactation Tools Discussed/Used    Interventions Interventions: DEBP;Education  Discharge Discharge Education: Other (comment) [facility] referral)  Consult Status Consult Status: Follow-up Date: 10/22/20 (telephone call) Follow-up type: Telephone Call    Maryruth Hancock [facility] 10/19/2020, 10:09 AM

## 2020-10-19 NOTE — Discharge Instructions (Signed)

## 2020-10-19 NOTE — Discharge Summary (Signed)
Postpartum Discharge Summary      Patient Name: Mindy Bradford DOB: Jul 13, 1985 MRN: [id number]  Date of admission: 10/17/2020 Delivery date:10/17/2020  Delivering provider:   Date of discharge: 10/19/2020  Admitting diagnosis: Molar pregnancy [O02.0] Intrauterine pregnancy: [redacted]w[redacted]d     Secondary diagnosis:  Active Problems:   Molar pregnancy  Additional problems: preterm delivery of a 27 week fetus, Asymptomatic covid positive    Discharge diagnosis: Preterm Pregnancy Delivered and Type 2 DM                                              Post partum procedures: D&C for evacuation of molar pregnancy and blood transfusion Augmentation:  Complications: None  Hospital course: Patient admitted following vaginal delivery at home of 27 week fetus. Patient underwent a D&C for evacuation of known molar pregnancy twin pregnancy. Patient with EBL 2500cc treated with blood transfusion. Patient had an otherwise uncomplicated pp course. She was found stable for discharge on POD#2. Discharge instructions reviewed with the patient    Physical exam  Vitals:   10/18/20 2027 10/18/20 2330 10/19/20 0523 10/19/20 0850  BP: (!) 147/62 (!) 147/65 138/70 (!) 141/69  Pulse:  86 84 88  Resp:  18 18 18   Temp:  98.3 F (36.8 C) 98 F (36.7 C) 98.4 F (36.9 C)  TempSrc:  Oral Oral Oral  SpO2:  100% 100% 99%   General: alert, cooperative and no distress Lochia: appropriate Uterine Fundus: firm DVT Evaluation: No evidence of DVT seen on physical exam. Labs: Lab Results  Component Value Date   WBC 8.0 10/18/2020   HGB 7.8 (L) 10/18/2020   HCT 23.6 (L) 10/18/2020   MCV 82.5 10/18/2020   PLT 92 (L) 10/18/2020   CMP Latest Ref Rng & Units 10/17/2020  Glucose 70 - 99 mg/dL -  BUN 6 - 20 mg/dL -  Creatinine 10/19/2020 - 8.46 mg/dL -  Sodium 6.59 - 935 mmol/L 140  Potassium 3.5 - 5.1 mmol/L 4.2  Chloride 98 - 111 mmol/L -  CO2 22 - 32 mmol/L -  Calcium 8.9 - 10.3 mg/dL -  Total Protein 6.5 - 8.1 g/dL -   Total Bilirubin 0.3 - 1.2 mg/dL -  Alkaline Phos 38 - 701 U/L -  AST 15 - 41 U/L -  ALT 0 - 44 U/L -   Edinburgh Score: Edinburgh Postnatal Depression Scale Screening Tool 10/17/2020  I have been able to laugh and see the funny side of things. 0  I have looked forward with enjoyment to things. 0  I have blamed myself unnecessarily when things went wrong. 1  I have been anxious or worried for no good reason. 0  I have felt scared or panicky for no good reason. 0  Things have been getting on top of me. 0  I have been so unhappy that I have had difficulty sleeping. 0  I have felt sad or miserable. 0  I have been so unhappy that I have been crying. 0  The thought of harming myself has occurred to me. 0  Edinburgh Postnatal Depression Scale Total 1     After visit meds:  Allergies as of 10/19/2020      Reactions   Sulfa Antibiotics Rash      Medication List    STOP taking these medications   insulin aspart 100  UNIT/ML injection Commonly known as: novoLOG   insulin glargine 100 UNIT/ML injection Commonly known as: LANTUS   insulin NPH Human 100 UNIT/ML injection Commonly known as: NOVOLIN N     TAKE these medications   aspirin EC 81 MG tablet Take 81 mg by mouth daily. Swallow whole.   aspirin-acetaminophen-caffeine 250-250-65 MG tablet Commonly known as: EXCEDRIN MIGRAINE Take 1 tablet by mouth every 6 (six) hours as needed for headache.   Dexcom G6 Sensor Misc by Does not apply route.   ibuprofen 600 MG tablet Commonly known as: ADVIL Take 1 tablet (600 mg total) by mouth every 6 (six) hours.   insulin pump Soln Inject 3 times daily with meals Correction bolus to be given if CBG reading between 250 and 300   oxyCODONE 5 MG immediate release tablet Commonly known as: Oxy IR/ROXICODONE Take 1 tablet (5 mg total) by mouth every 4 (four) hours as needed (pain scale 4-7).   prenatal multivitamin Tabs tablet Take 1 tablet by mouth daily at 12 noon.         Discharge home in stable condition Infant Feeding: Breast Infant Disposition:NICU Discharge instruction: per After Visit Summary and Postpartum booklet. Activity: Advance as tolerated. Pelvic rest for 6 weeks.  Diet: carb modified diet Future Appointments:No future appointments. Follow up Visit:  Follow-up Information    [facility] Follow up in 4 week(s).   Specialty: Obstetrics and Gynecology Why: postpartum visit Contact information: [address] [place] [address] [phone]               Please schedule this patient for a Virtual postpartum visit in 4 weeks with the following provider: MD. Additional Postpartum F/U:Postpartum Depression checkup  High risk pregnancy complicated by: twin molar pregnancy Delivery mode:    Anticipated Birth Control:  Unsure   10/19/2020 Catalina Antigua, MD

## 2020-10-19 NOTE — Lactation Note (Signed)
This note was copied from a baby's chart. Lactation Consultation Note  Patient Name: Mindy Bradford [id number] Date: 10/19/2020 Reason for consult: Follow-up assessment;NICU baby Age:35 hours   0920 - I followed up with Mindy Bradford. Her OB was also present and related to Mindy Bradford that she can be discharged today. Mindy Bradford informed me that she cannot return to see her daughter until next Sunday, which will be day 10 after her covid diagnosis. She seems to be asymptomatic. She has a video camera app to see baby on her phone.  Mindy Bradford has been pumping and hand expressing and sending baby droplets of her EBM. I praised her for her hard work. She states that she has WIC; she does not have a breast pump at home, and she does not have $30 for a deposit for a [facility] loaner pump. I showed her how to use her manual pump overnight and encouraged her to call WIC first thing tomorrow morning to collect her breast pump.  Plan: Pump and hand express at least 8 times a day. Label/store milk and have someone transfer it to the NICU. Call lactation number (office) around 5/16 to 5/18 to update on her progress with pump and how much milk she is expressing.  Maternal Data Has patient been taught Hand Expression?: Yes Does the patient have breastfeeding experience prior to this delivery?: Yes  Feeding Mother's Current Feeding Choice: Breast Milk and Donor Milk   Interventions Interventions: DEBP;Education  Discharge Discharge Education: Other (comment) [facility] referral)  Consult Status Consult Status: Follow-up Date: 10/22/20 (telephone call) Follow-up type: Telephone Call    Mindy Bradford 10/19/2020, 9:47 AM

## 2020-10-20 LAB — TYPE AND SCREEN
ABO/RH(D): A POS
Antibody Screen: NEGATIVE
Unit division: 0
Unit division: 0
Unit division: 0
Unit division: 0
Unit division: 0
Unit division: 0
Unit division: 0
Unit division: 0

## 2020-10-20 LAB — BPAM RBC
Blood Product Expiration Date: 202206022359
Blood Product Expiration Date: 202206032359
Blood Product Expiration Date: 202206032359
Blood Product Expiration Date: 202206032359
Blood Product Expiration Date: 202206052359
Blood Product Expiration Date: 202206062359
Blood Product Expiration Date: 202206062359
Blood Product Expiration Date: 202206102359
ISSUE DATE / TIME: 202205130832
ISSUE DATE / TIME: 202205130832
ISSUE DATE / TIME: 202205131517
ISSUE DATE / TIME: 202205132155
ISSUE DATE / TIME: 202205151009
Unit Type and Rh: 6200
Unit Type and Rh: 6200
Unit Type and Rh: 6200
Unit Type and Rh: 6200
Unit Type and Rh: 9500
Unit Type and Rh: 9500
Unit Type and Rh: 9500
Unit Type and Rh: 9500

## 2020-10-22 LAB — SURGICAL PATHOLOGY

## 2020-10-23 ENCOUNTER — Other Ambulatory Visit: Payer: Self-pay | Admitting: Obstetrics and Gynecology

## 2020-10-25 ENCOUNTER — Emergency Department (HOSPITAL_COMMUNITY)
Admission: EM | Admit: 2020-10-25 | Discharge: 2020-10-25 | Disposition: A | Discharge status: Home / Self Care | Payer: Medicaid Other | Attending: Emergency Medicine | Admitting: Emergency Medicine

## 2020-10-25 ENCOUNTER — Other Ambulatory Visit: Payer: Self-pay

## 2020-10-25 ENCOUNTER — Encounter (HOSPITAL_COMMUNITY): Payer: Self-pay | Admitting: Emergency Medicine

## 2020-10-25 ENCOUNTER — Emergency Department (HOSPITAL_COMMUNITY): Payer: Medicaid Other

## 2020-10-25 DIAGNOSIS — F17210 Nicotine dependence, cigarettes, uncomplicated: Secondary | ICD-10-CM | POA: Insufficient documentation

## 2020-10-25 DIAGNOSIS — Z7982 Long term (current) use of aspirin: Secondary | ICD-10-CM | POA: Diagnosis not present

## 2020-10-25 DIAGNOSIS — M25511 Pain in right shoulder: Secondary | ICD-10-CM | POA: Diagnosis not present

## 2020-10-25 DIAGNOSIS — E119 Type 2 diabetes mellitus without complications: Secondary | ICD-10-CM | POA: Diagnosis not present

## 2020-10-25 DIAGNOSIS — Z794 Long term (current) use of insulin: Secondary | ICD-10-CM | POA: Insufficient documentation

## 2020-10-25 MED ORDER — CYCLOBENZAPRINE HCL 10 MG PO TABS: 10.0000 mg | ORAL_TABLET | Freq: Two times a day (BID) | ORAL | 0 refills | Status: DC | PRN

## 2020-10-25 MED ORDER — KETOROLAC TROMETHAMINE 10 MG PO TABS: 10.0000 mg | ORAL_TABLET | Freq: Three times a day (TID) | ORAL | 0 refills | Status: AC | PRN

## 2020-10-25 NOTE — ED Notes (Signed)
Reviewed discharge instructions with patient. Follow-up care and medications reviewed. Patient  verbalized understanding. Patient A&Ox4, VSS, and ambulatory with steady gait upon discharge.  °
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
## 2020-10-25 NOTE — Discharge Instructions (Addendum)
Follow-up with an orthopedic doctor at the numbers listed above. Take the toradol for pain.  Do not take ibuprofen with this medicine and do take it with food.  You may also take the flexeril prescribed (will make you sleepy) and tylenol at the same time or alternating with the toradol.

## 2020-10-25 NOTE — Progress Notes (Signed)
Orthopedic Tech Progress Note Patient Details:  Mindy Bradford 02-24-1986 [id number]  Ortho Devices Type of Ortho Device: Shoulder immobilizer Ortho Device/Splint Location: RUE Ortho Device/Splint Interventions: Ordered,Application   Post Interventions Patient Tolerated: Well Instructions Provided: Adjustment of device   Maurene Capes 10/25/2020, 4:36 PM

## 2020-10-25 NOTE — ED Triage Notes (Signed)
Pt c/o right shoulder pain and inability to move her shoulder x 1 week.

## 2020-11-05 DIAGNOSIS — Z419 Encounter for procedure for purposes other than remedying health state, unspecified: Secondary | ICD-10-CM | POA: Diagnosis not present

## 2020-11-08 ENCOUNTER — Ambulatory Visit: Payer: Self-pay

## 2020-11-08 NOTE — Lactation Note (Signed)
This note was copied from a baby's chart. Lactation Consultation Note Mother is no longer pumping, per RN. LC services are complete.  Patient Name: Mindy Bradford [id number] Date: 11/08/2020   Age:35 wk.o.    Elder Negus, MA IBCLC 11/08/2020, 1:25 PM

## 2020-11-28 ENCOUNTER — Encounter: Payer: Self-pay | Admitting: *Deleted

## 2020-12-05 ENCOUNTER — Other Ambulatory Visit: Payer: Self-pay

## 2020-12-05 ENCOUNTER — Ambulatory Visit (INDEPENDENT_AMBULATORY_CARE_PROVIDER_SITE_OTHER): Payer: Medicaid Other | Admitting: Obstetrics & Gynecology

## 2020-12-05 ENCOUNTER — Encounter: Payer: Self-pay | Admitting: Obstetrics & Gynecology

## 2020-12-05 DIAGNOSIS — O020 Blighted ovum and nonhydatidiform mole: Secondary | ICD-10-CM | POA: Diagnosis not present

## 2020-12-05 DIAGNOSIS — Z419 Encounter for procedure for purposes other than remedying health state, unspecified: Secondary | ICD-10-CM | POA: Diagnosis not present

## 2020-12-05 DIAGNOSIS — Z30011 Encounter for initial prescription of contraceptive pills: Secondary | ICD-10-CM

## 2020-12-05 DIAGNOSIS — E119 Type 2 diabetes mellitus without complications: Secondary | ICD-10-CM | POA: Insufficient documentation

## 2020-12-05 MED ORDER — NORGESTIMATE-ETH ESTRADIOL 0.25-35 MG-MCG PO TABS: 1.0000 | ORAL_TABLET | Freq: Every day | ORAL | 11 refills | Status: DC

## 2020-12-05 NOTE — Progress Notes (Signed)
Post Partum Visit Note  Mindy Bradford is a 35 y.o. [phone] female who presents for a postpartum visit. She is 7 weeks postpartum following a spontaneous vaginal delivery at [redacted]w[redacted]d at home.  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 On 10/17/2020, came to [facility] after delivery at home, was cared for by [facility], and underwent D&E for the molar pregnancy retained after the home preterm delivery.  Diagnosis of molar pregnancy confirmed on pathology. HCG went from 10/17/20 [phone] to 57,[phone] on 10/19/20.   I have fully reviewed the prenatal and intrapartum course.  Anesthesia: general. Postpartum course has been uncomplicated. Baby is feeding by  formula, infant still in NICU. Marland Kitchen Bleeding no bleeding. Bowel function is normal. Bladder function is normal. Patient is not sexually active. Contraception method is none. Postpartum depression screening: negative.   The pregnancy intention screening data noted above was reviewed. Potential methods of contraception were discussed. The patient elected to proceed with Oral Contraceptive.    Edinburgh Postnatal Depression Scale - 12/05/20 0823       Edinburgh Postnatal Depression Scale:  In the Past 7 Days   I have been able to laugh and see the funny side of things. 0    I have looked forward with enjoyment to things. 0    I have blamed myself unnecessarily when things went wrong. 0    I have been anxious or worried for no good reason. 0    I have felt scared or panicky for no good reason. 0    Things have been getting on top of me. 0    I have been so unhappy that I have had difficulty sleeping. 0    I have felt sad or miserable. 0    I have been so unhappy that I have been crying. 0    The thought of harming myself has occurred to me. 0    Edinburgh Postnatal Depression Scale Total 0             Health Maintenance Due  Topic Date Due   COVID-19 Vaccine (1) Never done    Pneumococcal Vaccine 71-38 Years old (1 - PCV) Never done   URINE MICROALBUMIN  Never done   Hepatitis C Screening  Never done   PAP SMEAR-Modifier  12/06/2018    The following portions of the patient's history were reviewed and updated as appropriate: allergies, current medications, past family history, past medical history, past social history, past surgical history, and problem list.  Review of Systems Pertinent items noted in HPI and remainder of comprehensive ROS otherwise negative.  Objective:  BP 116/76   Pulse 98   Wt 229 lb 9.6 oz (104.1 kg)   LMP 04/11/2020   Breastfeeding No   BMI 35.96 kg/m    General:  alert and no distress   Breasts:  not indicated  Lungs: clear to auscultation bilaterally  Heart:  regular rate and rhythm  Abdomen: soft, non-tender; bowel sounds normal; no masses,  no organomegaly   GU exam:  not indicated       Assessment:  1. Postpartum care following vaginal delivery 2. Molar pregnancy 3. Initiation of OCPs 2. Type 2 diabetes  Plan:   Essential components of care per ACOG recommendations:  1.  Mood and well being: Patient with negative depression screening today. Reviewed local resources for support.  - Patient tobacco use? Yes. Patient desires to quit? No.   -  hx of drug use? No.    2. Infant care and feeding:  -Patient currently breastmilk feeding? No.  -Social determinants of health (SDOH) reviewed in EPIC. Concerned about food, will be taken to our [employer].  3. Sexuality, contraception and birth spacing - Patient does not want a pregnancy in the next year.  Desired family size is 4 children.  - Reviewed forms of contraception in tiered fashion. Patient desired oral contraceptives (estrogen/progesterone) today.   - Discussed birth spacing of 18 months especially given molar pregnancy  4. Sleep and fatigue -Encouraged family/partner/community support of 4 hrs of uninterrupted sleep to help with mood and fatigue  5. Physical  Recovery  - Discussed patients delivery and complications. She describes her labor as good. - Patient had a Vaginal, no problems at delivery.  - Patient has urinary incontinence? No. - Patient is safe to resume physical and sexual activity  6.  Health Maintenance - HM due items addressed Yes - Last pap smear was done at [facility] (records scanned under OB Prenatal in Media) on 08/20/2019. Was negative, negative HRHPV.  7. T2DM - PCP follow up  8. Molar pregnancy - Weekly HCG draws until negative then monthly for at least 3 months.   Jaynie Collins, MD Center for [employer], [facility]

## 2020-12-06 LAB — BETA HCG QUANT (REF LAB): hCG Quant: 749 m[IU]/mL

## 2020-12-09 ENCOUNTER — Telehealth: Payer: Self-pay | Admitting: General Practice

## 2020-12-09 DIAGNOSIS — O020 Blighted ovum and nonhydatidiform mole: Secondary | ICD-10-CM

## 2020-12-09 NOTE — Telephone Encounter (Signed)
-----   Message from Tereso Newcomer, MD sent at 12/06/2020  7:47 AM EDT ----- Continue weekly or every other weekly HCG draws until it is negative. Once negative, will check monthly for three months for monitoring of this molar pregnancy.

## 2020-12-09 NOTE — Telephone Encounter (Signed)
Called patient regarding setting up appt for bhcg. Patient states she can come 7/15 @ 11am.

## 2020-12-19 ENCOUNTER — Other Ambulatory Visit: Payer: Self-pay

## 2020-12-19 ENCOUNTER — Other Ambulatory Visit: Payer: Medicaid Other

## 2020-12-19 DIAGNOSIS — E119 Type 2 diabetes mellitus without complications: Secondary | ICD-10-CM | POA: Diagnosis not present

## 2020-12-19 DIAGNOSIS — Z9641 Presence of insulin pump (external) (internal): Secondary | ICD-10-CM | POA: Diagnosis not present

## 2020-12-19 DIAGNOSIS — O020 Blighted ovum and nonhydatidiform mole: Secondary | ICD-10-CM

## 2020-12-19 DIAGNOSIS — R946 Abnormal results of thyroid function studies: Secondary | ICD-10-CM | POA: Diagnosis not present

## 2020-12-19 DIAGNOSIS — E041 Nontoxic single thyroid nodule: Secondary | ICD-10-CM | POA: Diagnosis not present

## 2020-12-19 DIAGNOSIS — Z794 Long term (current) use of insulin: Secondary | ICD-10-CM | POA: Diagnosis not present

## 2020-12-20 LAB — BETA HCG QUANT (REF LAB): hCG Quant: 604 m[IU]/mL

## 2020-12-20 NOTE — Progress Notes (Signed)
Needs continued weekly HCG draws until negative for surveillance after evacuation of molar pregnancy. If levels not declining appropriately, may need ONC referral.

## 2020-12-22 ENCOUNTER — Telehealth: Payer: Self-pay

## 2020-12-22 NOTE — Telephone Encounter (Signed)
Call placed to pt. Spoke with pt. Pt given results and recommendations per Dr Macon Large. Pt agreeable to plan of care. Pt to have Non-stat Betas starting this Friday until negative. Pt verbalized understanding.  Front office to set up these appts.   Judeth Cornfield, RN

## 2020-12-22 NOTE — Telephone Encounter (Signed)
-----   Message from Tereso Newcomer, MD sent at 12/20/2020  6:48 PM EDT ----- Needs continued weekly HCG draws until negative for surveillance after evacuation of molar pregnancy. If levels not declining appropriately, may need ONC referral.

## 2020-12-26 ENCOUNTER — Other Ambulatory Visit: Payer: Medicaid Other

## 2020-12-26 ENCOUNTER — Other Ambulatory Visit: Payer: Self-pay

## 2020-12-26 DIAGNOSIS — O020 Blighted ovum and nonhydatidiform mole: Secondary | ICD-10-CM

## 2020-12-27 LAB — BETA HCG QUANT (REF LAB): hCG Quant: 346 m[IU]/mL

## 2020-12-30 ENCOUNTER — Telehealth: Payer: Self-pay | Admitting: General Practice

## 2020-12-30 NOTE — Telephone Encounter (Signed)
Called patient, no answer- left message to check mychart account for results. Mychart message sent.

## 2020-12-30 NOTE — Telephone Encounter (Signed)
-----   Message from Tereso Newcomer, MD sent at 12/29/2020  8:32 AM EDT ----- Needs continued weekly HCG draws until negative for surveillance after evacuation of molar pregnancy.  ----- Message ----- From: Interface, [facility] Lab Results In Sent: 12/27/2020   3:46 AM EDT To: Tereso Newcomer, MD

## 2021-01-01 ENCOUNTER — Other Ambulatory Visit: Payer: Self-pay | Admitting: *Deleted

## 2021-01-01 DIAGNOSIS — O020 Blighted ovum and nonhydatidiform mole: Secondary | ICD-10-CM

## 2021-01-02 ENCOUNTER — Other Ambulatory Visit: Payer: Self-pay

## 2021-01-02 ENCOUNTER — Other Ambulatory Visit: Payer: Medicaid Other

## 2021-01-02 DIAGNOSIS — O020 Blighted ovum and nonhydatidiform mole: Secondary | ICD-10-CM

## 2021-01-03 LAB — BETA HCG QUANT (REF LAB): hCG Quant: 94 m[IU]/mL

## 2021-01-05 ENCOUNTER — Other Ambulatory Visit: Payer: Medicaid Other

## 2021-01-05 DIAGNOSIS — Z419 Encounter for procedure for purposes other than remedying health state, unspecified: Secondary | ICD-10-CM | POA: Diagnosis not present

## 2021-01-07 ENCOUNTER — Other Ambulatory Visit: Payer: Self-pay | Admitting: General Practice

## 2021-01-07 DIAGNOSIS — O020 Blighted ovum and nonhydatidiform mole: Secondary | ICD-10-CM

## 2021-01-09 ENCOUNTER — Other Ambulatory Visit: Payer: Medicaid Other

## 2021-01-09 ENCOUNTER — Other Ambulatory Visit: Payer: Self-pay

## 2021-01-09 DIAGNOSIS — O020 Blighted ovum and nonhydatidiform mole: Secondary | ICD-10-CM | POA: Diagnosis not present

## 2021-01-10 LAB — BETA HCG QUANT (REF LAB): hCG Quant: 58 m[IU]/mL

## 2021-01-12 ENCOUNTER — Telehealth: Payer: Self-pay

## 2021-01-12 NOTE — Telephone Encounter (Addendum)
-----   Message from Tereso Newcomer, MD sent at 01/10/2021 11:45 AM EDT ----- Continue weekly HCG checks for molar pregnancy surveillance  Called pt. Result given and provider recommendation given. Pt states she has 4 upcoming lab appts already scheduled. Next lab visit is scheduled for 01/16/21/

## 2021-01-15 ENCOUNTER — Other Ambulatory Visit: Payer: Self-pay | Admitting: General Practice

## 2021-01-15 DIAGNOSIS — O020 Blighted ovum and nonhydatidiform mole: Secondary | ICD-10-CM

## 2021-01-16 ENCOUNTER — Other Ambulatory Visit: Payer: Medicaid Other

## 2021-01-16 ENCOUNTER — Other Ambulatory Visit: Payer: Self-pay

## 2021-01-16 DIAGNOSIS — O020 Blighted ovum and nonhydatidiform mole: Secondary | ICD-10-CM | POA: Diagnosis not present

## 2021-01-17 LAB — BETA HCG QUANT (REF LAB): hCG Quant: 32 m[IU]/mL

## 2021-01-22 ENCOUNTER — Other Ambulatory Visit: Payer: Self-pay | Admitting: General Practice

## 2021-01-22 DIAGNOSIS — O020 Blighted ovum and nonhydatidiform mole: Secondary | ICD-10-CM

## 2021-01-23 ENCOUNTER — Other Ambulatory Visit: Payer: Self-pay

## 2021-01-23 ENCOUNTER — Other Ambulatory Visit: Payer: Medicaid Other

## 2021-01-23 DIAGNOSIS — O020 Blighted ovum and nonhydatidiform mole: Secondary | ICD-10-CM

## 2021-01-24 LAB — BETA HCG QUANT (REF LAB): hCG Quant: 34 m[IU]/mL

## 2021-01-27 ENCOUNTER — Telehealth: Payer: Self-pay | Admitting: General Practice

## 2021-01-27 NOTE — Telephone Encounter (Signed)
-----   Message from Tereso Newcomer, MD sent at 01/26/2021 10:27 AM EDT ----- Continue weekly HCG until negative

## 2021-01-27 NOTE — Telephone Encounter (Signed)
Called patient & informed her of bhcg results. Reminded her of appt on Friday for repeat draw. Patient states she cannot come that day as her child has an appt. Rescheduled appt for 8/25. Patient asked what would happen if levels keep rising. Told patient I couldn't say but if levels were the same this week or they increased Dr Macon Large would likely reach out to her with additional information/plan of care. Patient verbalized understanding.

## 2021-01-29 ENCOUNTER — Other Ambulatory Visit: Payer: Self-pay

## 2021-01-29 ENCOUNTER — Other Ambulatory Visit: Payer: Self-pay | Admitting: General Practice

## 2021-01-29 ENCOUNTER — Other Ambulatory Visit: Payer: Medicaid Other

## 2021-01-29 DIAGNOSIS — O020 Blighted ovum and nonhydatidiform mole: Secondary | ICD-10-CM

## 2021-01-30 ENCOUNTER — Other Ambulatory Visit: Payer: Medicaid Other

## 2021-01-30 LAB — BETA HCG QUANT (REF LAB): hCG Quant: 24 m[IU]/mL

## 2021-02-05 DIAGNOSIS — Z419 Encounter for procedure for purposes other than remedying health state, unspecified: Secondary | ICD-10-CM | POA: Diagnosis not present

## 2021-02-06 ENCOUNTER — Other Ambulatory Visit: Payer: Self-pay

## 2021-02-06 ENCOUNTER — Other Ambulatory Visit: Payer: Medicaid Other

## 2021-02-06 ENCOUNTER — Other Ambulatory Visit: Payer: Self-pay | Admitting: General Practice

## 2021-02-06 DIAGNOSIS — O020 Blighted ovum and nonhydatidiform mole: Secondary | ICD-10-CM | POA: Diagnosis not present

## 2021-02-07 LAB — BETA HCG QUANT (REF LAB): hCG Quant: 15 m[IU]/mL

## 2021-02-11 ENCOUNTER — Telehealth: Payer: Self-pay

## 2021-02-11 NOTE — Telephone Encounter (Addendum)
-----   Message from Tereso Newcomer, MD sent at 02/10/2021  7:16 PM EDT ----- Continue weekly HCG until negative   Called pt: VM left stating I am calling with results. Callback number given. MyChart message sent.

## 2021-02-12 ENCOUNTER — Other Ambulatory Visit: Payer: Self-pay

## 2021-02-12 DIAGNOSIS — O020 Blighted ovum and nonhydatidiform mole: Secondary | ICD-10-CM

## 2021-02-13 ENCOUNTER — Other Ambulatory Visit: Payer: Self-pay

## 2021-02-13 ENCOUNTER — Other Ambulatory Visit: Payer: Medicaid Other

## 2021-02-13 DIAGNOSIS — O020 Blighted ovum and nonhydatidiform mole: Secondary | ICD-10-CM

## 2021-02-14 LAB — BETA HCG QUANT (REF LAB): hCG Quant: 10 m[IU]/mL

## 2021-02-16 ENCOUNTER — Telehealth: Payer: Self-pay

## 2021-02-16 NOTE — Telephone Encounter (Signed)
-----   Message from Tereso Newcomer, MD sent at 02/16/2021 10:01 AM EDT ----- Needs continued weekly HCG lab draws until negative (<5). Then will need monthly hCG levels for a total of three months, continued contraception recommended as a new pregnancy during this period would make it difficult or impossible to interpret hCG results and would complicate management. Results were released to MyChart and patient was given recommendations as indicated.

## 2021-02-16 NOTE — Telephone Encounter (Signed)
Attempt to reach patient regarding her lab results and to schedule another HCG lab draw.  No answer and LVM.   Felecia Shelling, CMA

## 2021-02-18 ENCOUNTER — Telehealth: Payer: Self-pay

## 2021-02-18 DIAGNOSIS — O020 Blighted ovum and nonhydatidiform mole: Secondary | ICD-10-CM

## 2021-02-18 NOTE — Telephone Encounter (Addendum)
-----   Message from Tereso Newcomer, MD sent at 02/16/2021 10:01 AM EDT ----- Needs continued weekly HCG lab draws until negative (<5). Then will need monthly hCG levels for a total of three months, continued contraception recommended as a new pregnancy during this period would make it difficult or impossible to interpret hCG results and would complicate management. Results were released to MyChart and patient was given recommendations as indicated.   Lab appt made for 02/24/21 and future ordered placed for beta HCG. Pt will be out of town until next Tuesday, requests 10 AM appt.

## 2021-02-24 ENCOUNTER — Other Ambulatory Visit: Payer: Self-pay

## 2021-02-24 ENCOUNTER — Other Ambulatory Visit: Payer: Medicaid Other

## 2021-02-24 DIAGNOSIS — O020 Blighted ovum and nonhydatidiform mole: Secondary | ICD-10-CM

## 2021-02-25 LAB — BETA HCG QUANT (REF LAB): hCG Quant: 3 m[IU]/mL

## 2021-03-07 DIAGNOSIS — Z419 Encounter for procedure for purposes other than remedying health state, unspecified: Secondary | ICD-10-CM | POA: Diagnosis not present

## 2021-04-02 ENCOUNTER — Other Ambulatory Visit: Payer: Self-pay | Admitting: *Deleted

## 2021-04-02 DIAGNOSIS — O020 Blighted ovum and nonhydatidiform mole: Secondary | ICD-10-CM

## 2021-04-03 ENCOUNTER — Other Ambulatory Visit: Payer: Medicaid Other

## 2021-04-03 ENCOUNTER — Other Ambulatory Visit: Payer: Self-pay

## 2021-04-03 DIAGNOSIS — O020 Blighted ovum and nonhydatidiform mole: Secondary | ICD-10-CM | POA: Diagnosis not present

## 2021-04-04 LAB — BETA HCG QUANT (REF LAB): hCG Quant: 3 m[IU]/mL

## 2021-04-07 DIAGNOSIS — Z419 Encounter for procedure for purposes other than remedying health state, unspecified: Secondary | ICD-10-CM | POA: Diagnosis not present

## 2021-04-08 ENCOUNTER — Telehealth: Payer: Self-pay

## 2021-04-08 NOTE — Progress Notes (Signed)
Patient now has negative HCG (<5) on 04/03/21.  For surveillance of her molar pregnancy after HCG is negative, she needs monthly hCG levels for a total of three months, to ensure the value continues to be negative. Patient should be strongly encouraged to be on contraception during this entire surveillance period.  A new pregnancy during this period would make it difficult or impossible to interpret hCG results and would complicate management.  Conception should only be attempted after this surveillance period.   Jaynie Collins, MD, FACOG Obstetrician & Gynecologist, [facility] for [employer], [facility]

## 2021-04-08 NOTE — Telephone Encounter (Signed)
-----   Message from Tereso Newcomer, MD sent at 04/08/2021  9:03 AM EDT ----- Patient now has negative HCG (<5) on 04/03/21.  For surveillance of her molar pregnancy after HCG is negative, she needs monthly hCG levels for a total of three months, to ensure the value continues to be negative. Patient should be strongly encouraged to be on contraception during this entire surveillance period.  A new pregnancy during this period would make it difficult or impossible to interpret hCG results and would complicate management.  Conception should only be attempted after this surveillance period.   Jaynie Collins, MD, FACOG Obstetrician & Gynecologist, [facility] for [employer], [facility]

## 2021-04-08 NOTE — Telephone Encounter (Signed)
Spoke with pt.  Pt given results and recommendations per Dr Macon Large. Pt verbalized understanding and agreeable to plan of care.Pt has three appts for lab draws. Pt agreeable to date and times of all appts.  Judeth Cornfield, RN

## 2021-05-04 ENCOUNTER — Other Ambulatory Visit: Payer: Medicaid Other

## 2021-05-04 ENCOUNTER — Other Ambulatory Visit: Payer: Self-pay | Admitting: *Deleted

## 2021-05-04 ENCOUNTER — Other Ambulatory Visit: Payer: Self-pay

## 2021-05-04 DIAGNOSIS — O020 Blighted ovum and nonhydatidiform mole: Secondary | ICD-10-CM

## 2021-05-05 LAB — BETA HCG QUANT (REF LAB): hCG Quant: <1 m[IU]/mL

## 2021-05-06 DIAGNOSIS — Z9641 Presence of insulin pump (external) (internal): Secondary | ICD-10-CM | POA: Diagnosis not present

## 2021-05-06 DIAGNOSIS — E119 Type 2 diabetes mellitus without complications: Secondary | ICD-10-CM | POA: Diagnosis not present

## 2021-05-06 DIAGNOSIS — E041 Nontoxic single thyroid nodule: Secondary | ICD-10-CM | POA: Diagnosis not present

## 2021-05-06 DIAGNOSIS — E669 Obesity, unspecified: Secondary | ICD-10-CM | POA: Diagnosis not present

## 2021-05-06 DIAGNOSIS — Z794 Long term (current) use of insulin: Secondary | ICD-10-CM | POA: Diagnosis not present

## 2021-05-06 DIAGNOSIS — R7989 Other specified abnormal findings of blood chemistry: Secondary | ICD-10-CM | POA: Diagnosis not present

## 2021-05-07 DIAGNOSIS — Z419 Encounter for procedure for purposes other than remedying health state, unspecified: Secondary | ICD-10-CM | POA: Diagnosis not present

## 2021-06-05 ENCOUNTER — Other Ambulatory Visit: Payer: Medicaid Other

## 2021-06-07 DIAGNOSIS — Z419 Encounter for procedure for purposes other than remedying health state, unspecified: Secondary | ICD-10-CM | POA: Diagnosis not present

## 2021-07-03 ENCOUNTER — Other Ambulatory Visit: Payer: Medicaid Other

## 2021-07-08 DIAGNOSIS — Z419 Encounter for procedure for purposes other than remedying health state, unspecified: Secondary | ICD-10-CM | POA: Diagnosis not present

## 2021-08-05 DIAGNOSIS — Z419 Encounter for procedure for purposes other than remedying health state, unspecified: Secondary | ICD-10-CM | POA: Diagnosis not present

## 2021-08-07 DIAGNOSIS — E1165 Type 2 diabetes mellitus with hyperglycemia: Secondary | ICD-10-CM | POA: Diagnosis not present

## 2021-08-07 DIAGNOSIS — Z9641 Presence of insulin pump (external) (internal): Secondary | ICD-10-CM | POA: Diagnosis not present

## 2021-08-07 DIAGNOSIS — Z7985 Long-term (current) use of injectable non-insulin antidiabetic drugs: Secondary | ICD-10-CM | POA: Diagnosis not present

## 2021-08-07 DIAGNOSIS — E041 Nontoxic single thyroid nodule: Secondary | ICD-10-CM | POA: Diagnosis not present

## 2021-08-07 DIAGNOSIS — R7989 Other specified abnormal findings of blood chemistry: Secondary | ICD-10-CM | POA: Diagnosis not present

## 2021-08-07 DIAGNOSIS — Z794 Long term (current) use of insulin: Secondary | ICD-10-CM | POA: Diagnosis not present

## 2021-08-07 DIAGNOSIS — E119 Type 2 diabetes mellitus without complications: Secondary | ICD-10-CM | POA: Diagnosis not present

## 2021-08-07 DIAGNOSIS — Z6838 Body mass index (BMI) 38.0-38.9, adult: Secondary | ICD-10-CM | POA: Diagnosis not present

## 2021-08-07 DIAGNOSIS — Z7689 Persons encountering health services in other specified circumstances: Secondary | ICD-10-CM | POA: Diagnosis not present

## 2021-08-07 DIAGNOSIS — E669 Obesity, unspecified: Secondary | ICD-10-CM | POA: Diagnosis not present

## 2021-09-05 DIAGNOSIS — Z419 Encounter for procedure for purposes other than remedying health state, unspecified: Secondary | ICD-10-CM | POA: Diagnosis not present

## 2021-10-05 DIAGNOSIS — Z419 Encounter for procedure for purposes other than remedying health state, unspecified: Secondary | ICD-10-CM | POA: Diagnosis not present

## 2021-11-05 DIAGNOSIS — Z419 Encounter for procedure for purposes other than remedying health state, unspecified: Secondary | ICD-10-CM | POA: Diagnosis not present

## 2021-11-16 ENCOUNTER — Other Ambulatory Visit: Payer: Self-pay | Admitting: Pharmacist

## 2021-11-16 ENCOUNTER — Encounter: Payer: Self-pay | Admitting: Pharmacist

## 2021-11-16 DIAGNOSIS — Z30011 Encounter for initial prescription of contraceptive pills: Secondary | ICD-10-CM

## 2021-11-16 MED ORDER — NORGESTIMATE-ETH ESTRADIOL 0.25-35 MG-MCG PO TABS: 1.0000 | ORAL_TABLET | Freq: Every day | ORAL | 2 refills | Status: DC

## 2021-11-19 IMAGING — US US MFM OB LIMITED
1 series · 15 of 28 positions shown · non-contrast
Comparison: none

[Series 1: us mfm ob limited · 15 of 38 slices shown]
[im 1/38]
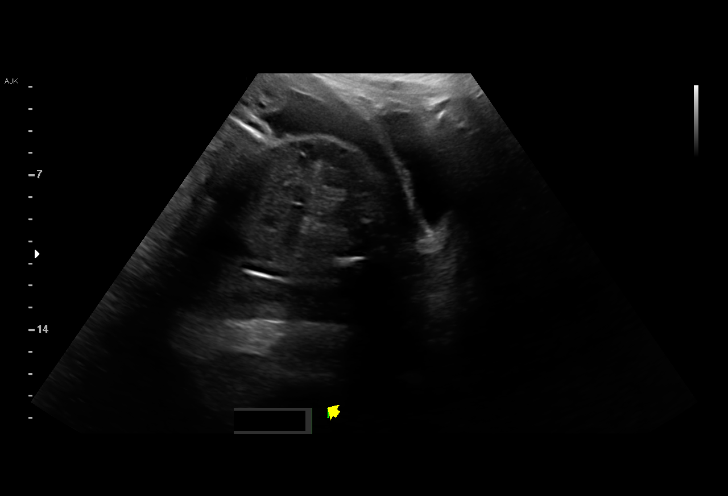
[im 3/38]
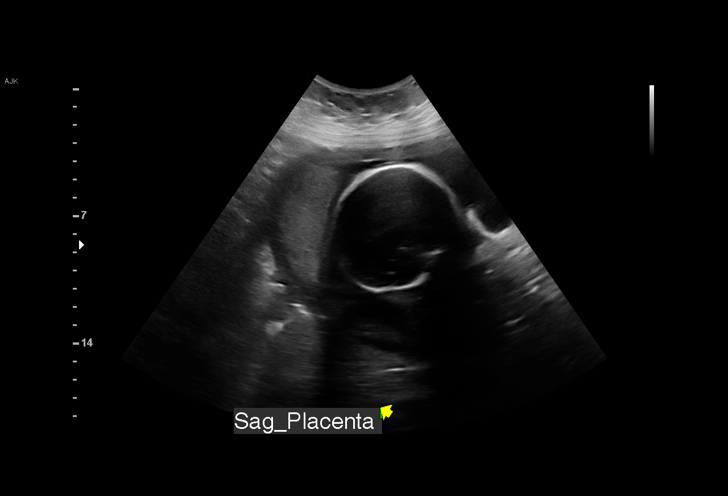
[im 6/38]
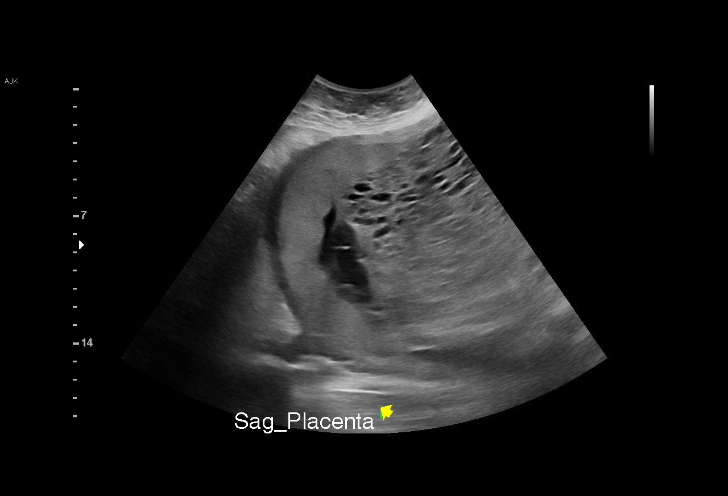
[im 9/38]
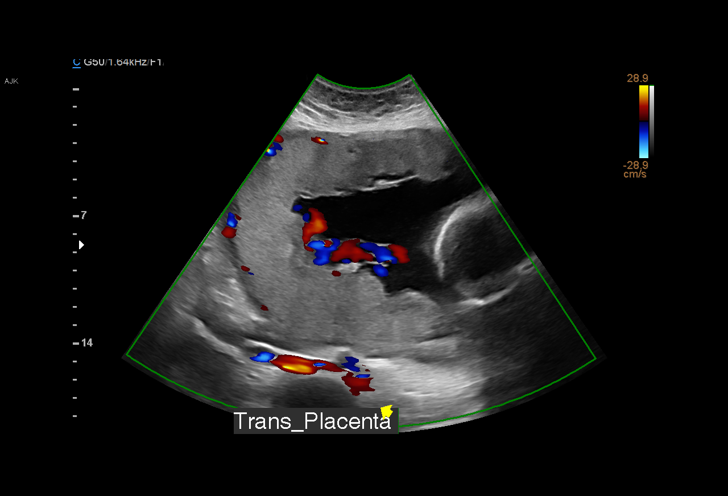
[im 11/38]
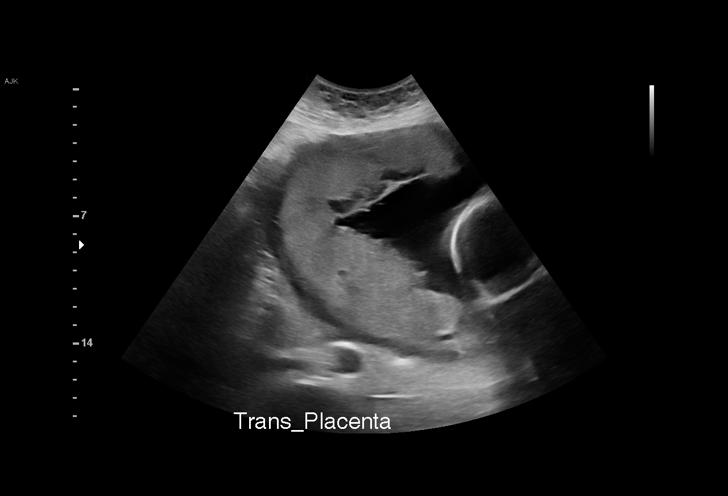
[im 14/38]
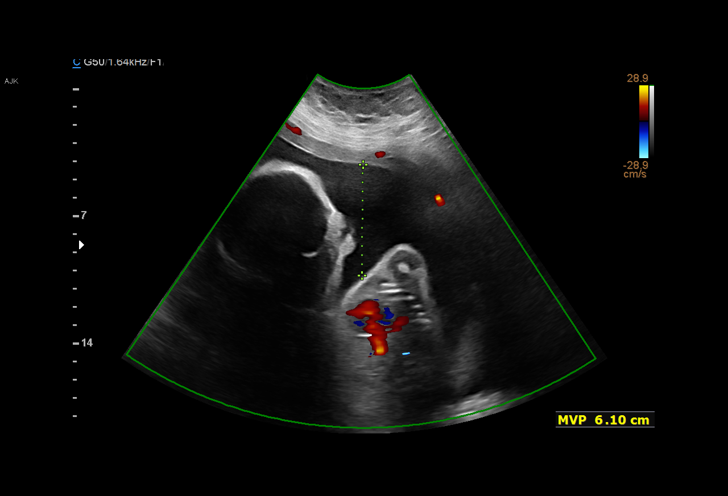
[im 17/38]
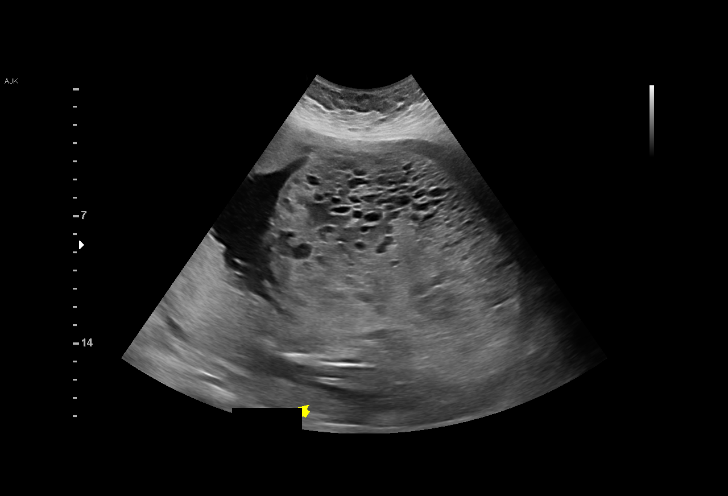
[im 20/38]
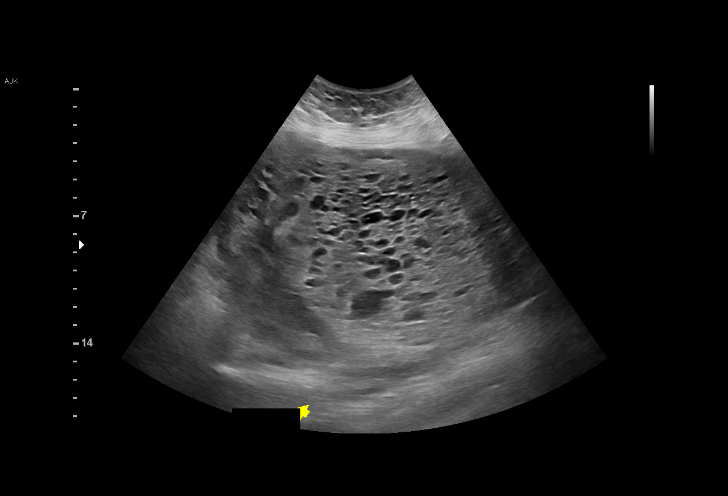
[im 21/38]
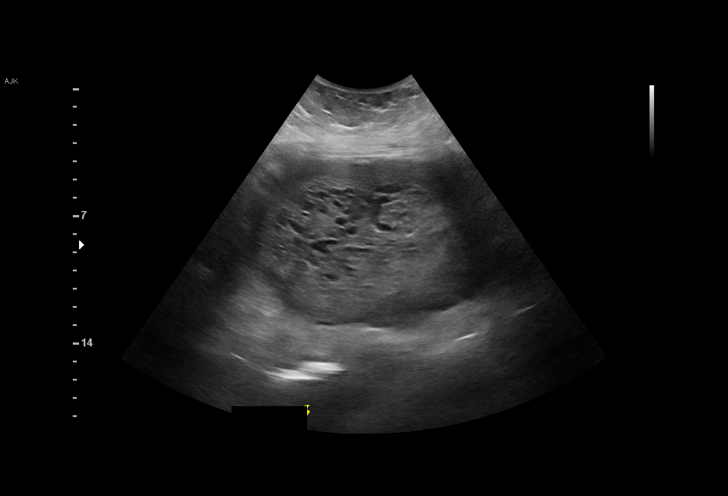
[im 24/38]
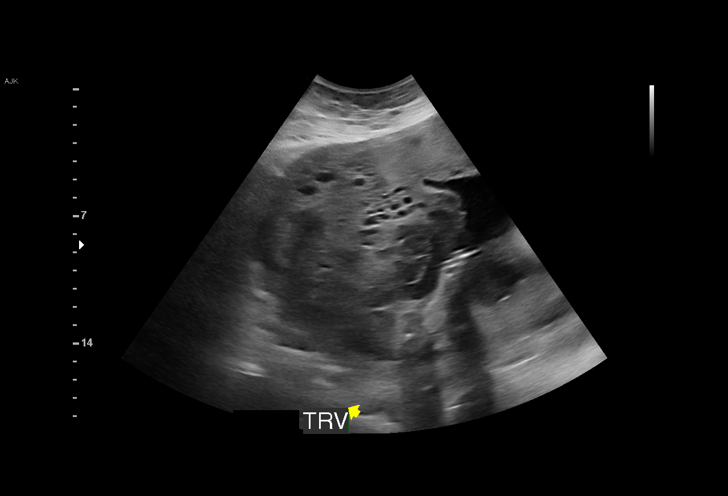
[im 27/38]
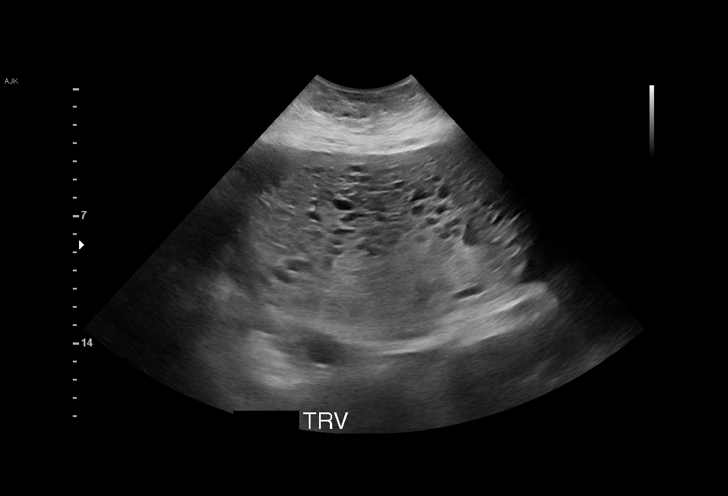
[im 29/38]
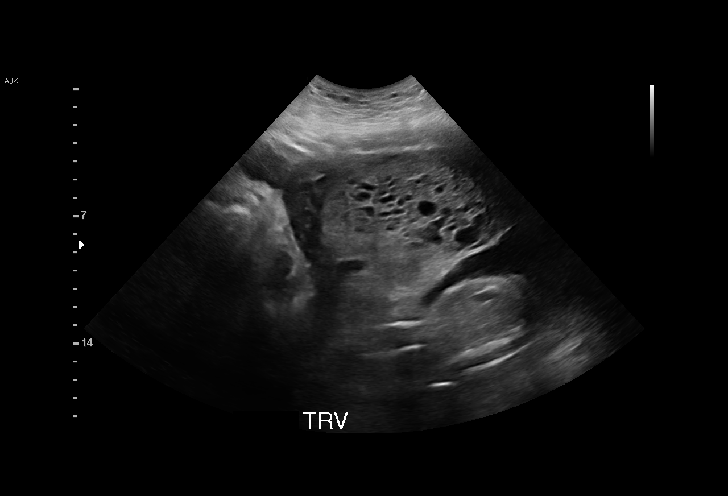
[im 32/38]
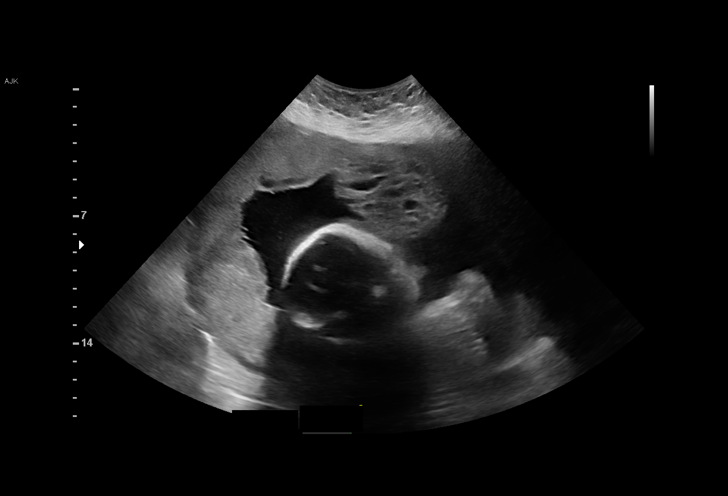
[im 35/38]
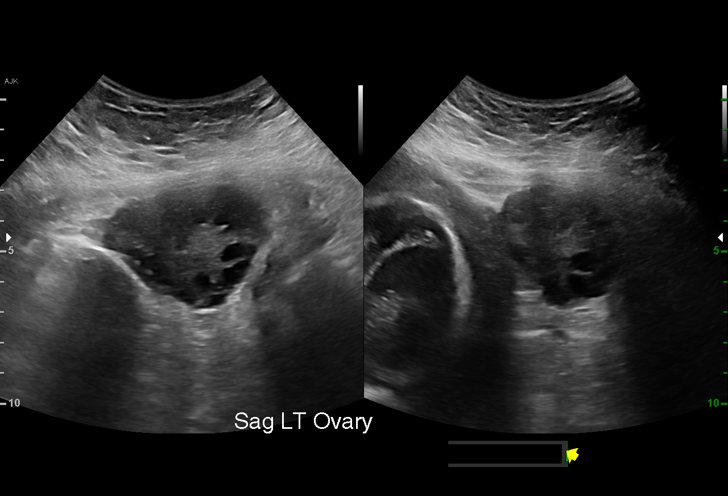
[im 38/38]
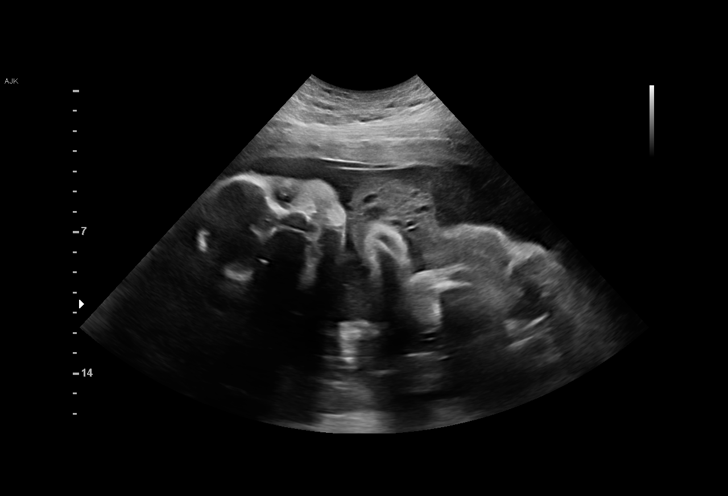

[15 of 28 positions shown; findings below may reference images not displayed]

OBGYN
                   CARL[facility]

Indications

 26 weeks gestation of pregnancy
 Molar pregnancy
 Poor obstetric history: Previous preterm
 delivery, antepartum (33 weeks, PPROM)
 Previous cervical surgery (LEEP)
 Abnormal fetal ultrasound
 Tobacco use complicating pregnancy
 Obesity complicating pregnancy
Fetal Evaluation

 Num Of Fetuses:         1
 Fetal Heart Rate(bpm):  137
 Cardiac Activity:       Observed
 Presentation:           Breech
 Placenta:               Posterior
 P. Cord Insertion:      Visualized

 Amniotic Fluid
 AFI FV:      Within normal limits

                             Largest Pocket(cm)


 Comment:    No placental abruption or previa identified. Complete mole noted
             as previously seen.
OB History

 Blood Type:   A+
 Gravidity:    4         Term:   2        Prem:   1        SAB:   0
 TOP:          0       Ectopic:  0        Living: 2
Gestational Age

 LMP:           26w 6d        Date:  04/11/20                 EDD:   01/16/21
 Best:          26w 6d     Det. By:  LMP  (04/11/20)          EDD:   01/16/21
Cervix Uterus Adnexa

 Cervix
 Length:           3.69  cm.
 Normal appearance by transabdominal scan.

 Uterus
 No abnormality visualized.

 Right Ovary
 Within normal limits.

 Left Ovary
 Within normal limits.

 Adnexa
 No abnormality visualized.
Impression

 On ultrasound, amniotic fluid is normal and good fetal activity
 is seen. Placenta appears normal with no evidence of
 retroplacental hemorrhage. Complete molar pregnancy is
 seen.
 On transabdominal scan, the cervix appears long and closed.

 See [REDACTED] for inpatient consultation.
Recommendations

 -Discharge home with instruction to go to [facility]
 [facility] if vaginal bleeding recurs.
 -Recurrence of vaginal bleeding is highly likely.

                 Wong, Sigfredo

## 2021-11-20 IMAGING — US US PELVIS COMPLETE
2 series · 6 of 6 positions shown · non-contrast
Comparison: 10/16/2020

CLINICAL DATA: Molar pregnancy, severe postpartum bleeding

EXAM:
TRANSABDOMINAL ULTRASOUND OF PELVIS
TECHNIQUE: Transabdominal ultrasound examination of the pelvis was performed
including evaluation of the uterus, ovaries, adnexal regions, and
pelvic cul-de-sac.

[Series 1: us pelvis complete · 5 acquisitions, 5 frames shown (1 of 2)]
[im 1/5]
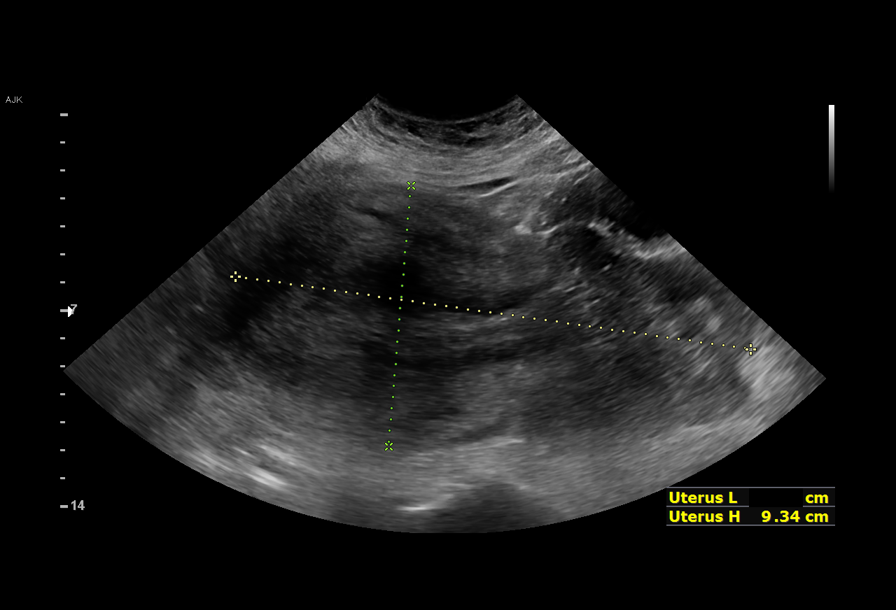
[im 2/5]
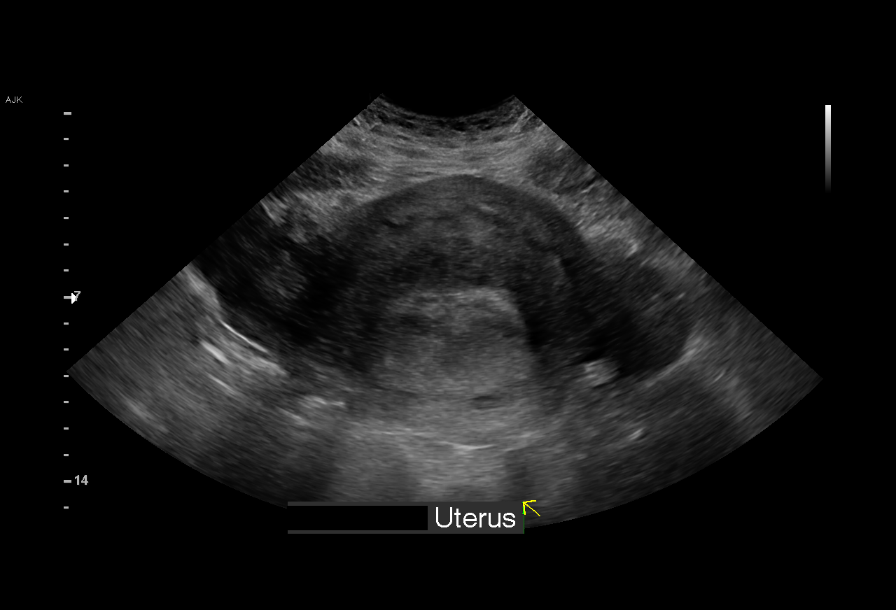
[im 3/5]
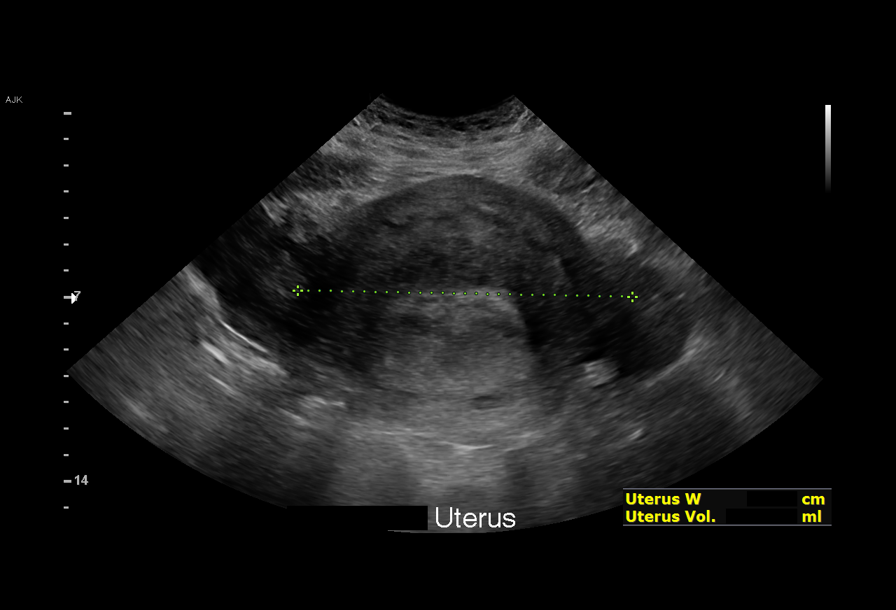
[im 4/5]
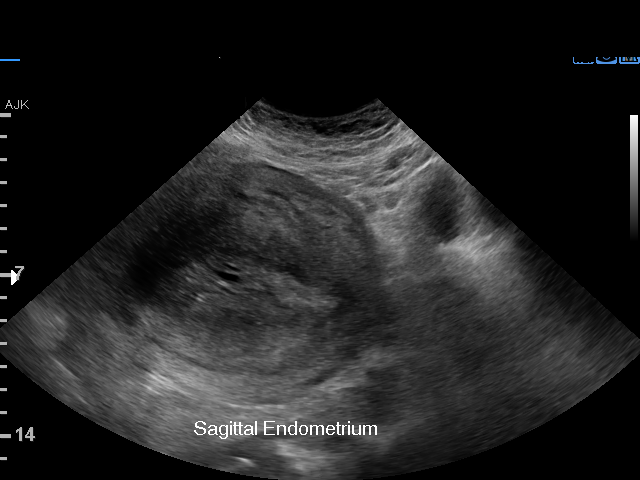
[im 5/5]
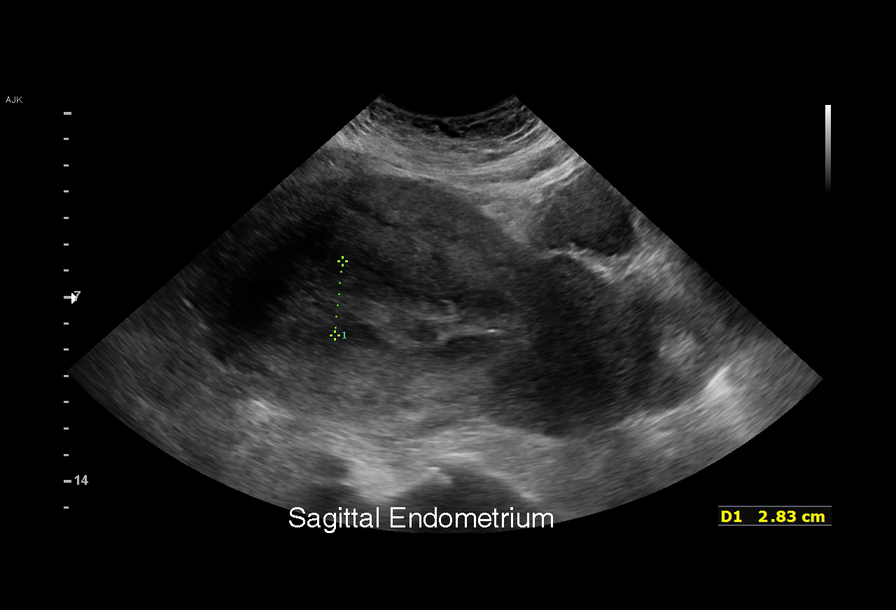

[Series 3: us pelvis complete · 1 of 1 slices shown (2 of 2)]
[im 1/1]
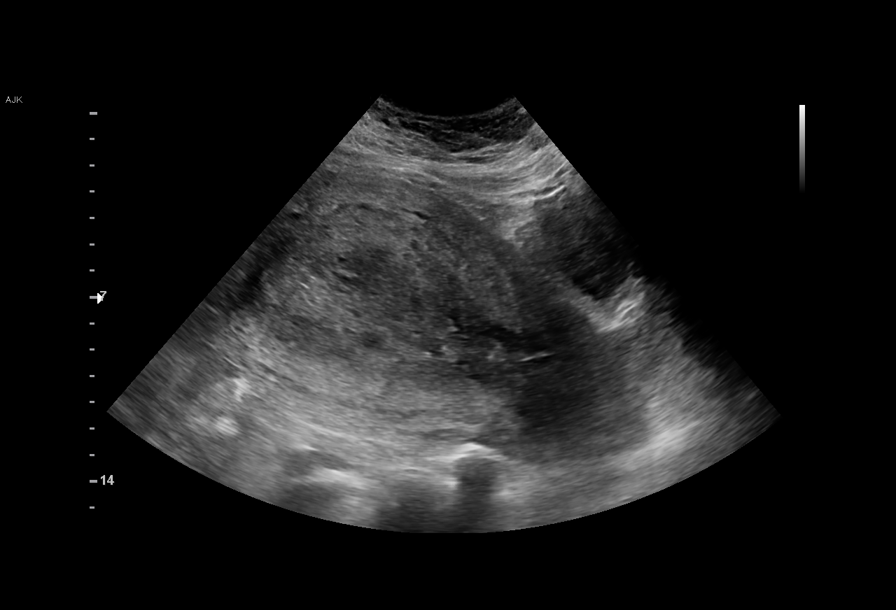

[6 of 6 positions shown; findings below may reference images not displayed]

FINDINGS: Uterus

Measurements: 18.5 x 9.3 x 12.7 cm = volume: 5503 mL. Enlarged
postpartum uterus. Heterogeneous myometrium. Nodularity along
anterior wall of lower uterine segment, may either represent a small
leiomyoma or sequela of prior Caesarean section. No additional
uterine masses.

Endometrium

Thickness: 28 mm. Thickened, heterogeneous, likely combination of
hemorrhage and endometrium. No discrete mass. Few small cystic foci
are seen at the upper uterine segment, may represent minimal
endometrial fluid or retained products of conception/molar
pregnancy.

Right ovary

Not imaged

Left ovary

Not imaged

Other findings:  No free pelvic fluid.
IMPRESSION: Thickened heterogeneous endometrial canal likely containing acute
blood.

Few small cystic foci at upper uterine segment of endometrial canal
may represent fluid or retained products of conception/molar
pregnancy.

Procedure was prematurely terminated prior to imaging of the ovaries
and patient was taken to the operating room due to active bleeding.

## 2021-11-28 IMAGING — CR DG SHOULDER 2+V*R*
2 series · 2 of 2 positions shown · non-contrast
Comparison: None.

CLINICAL DATA: Atraumatic right shoulder pain x1 week.

EXAM:
RIGHT SHOULDER - 2+ VIEW

[shoulder grashey]
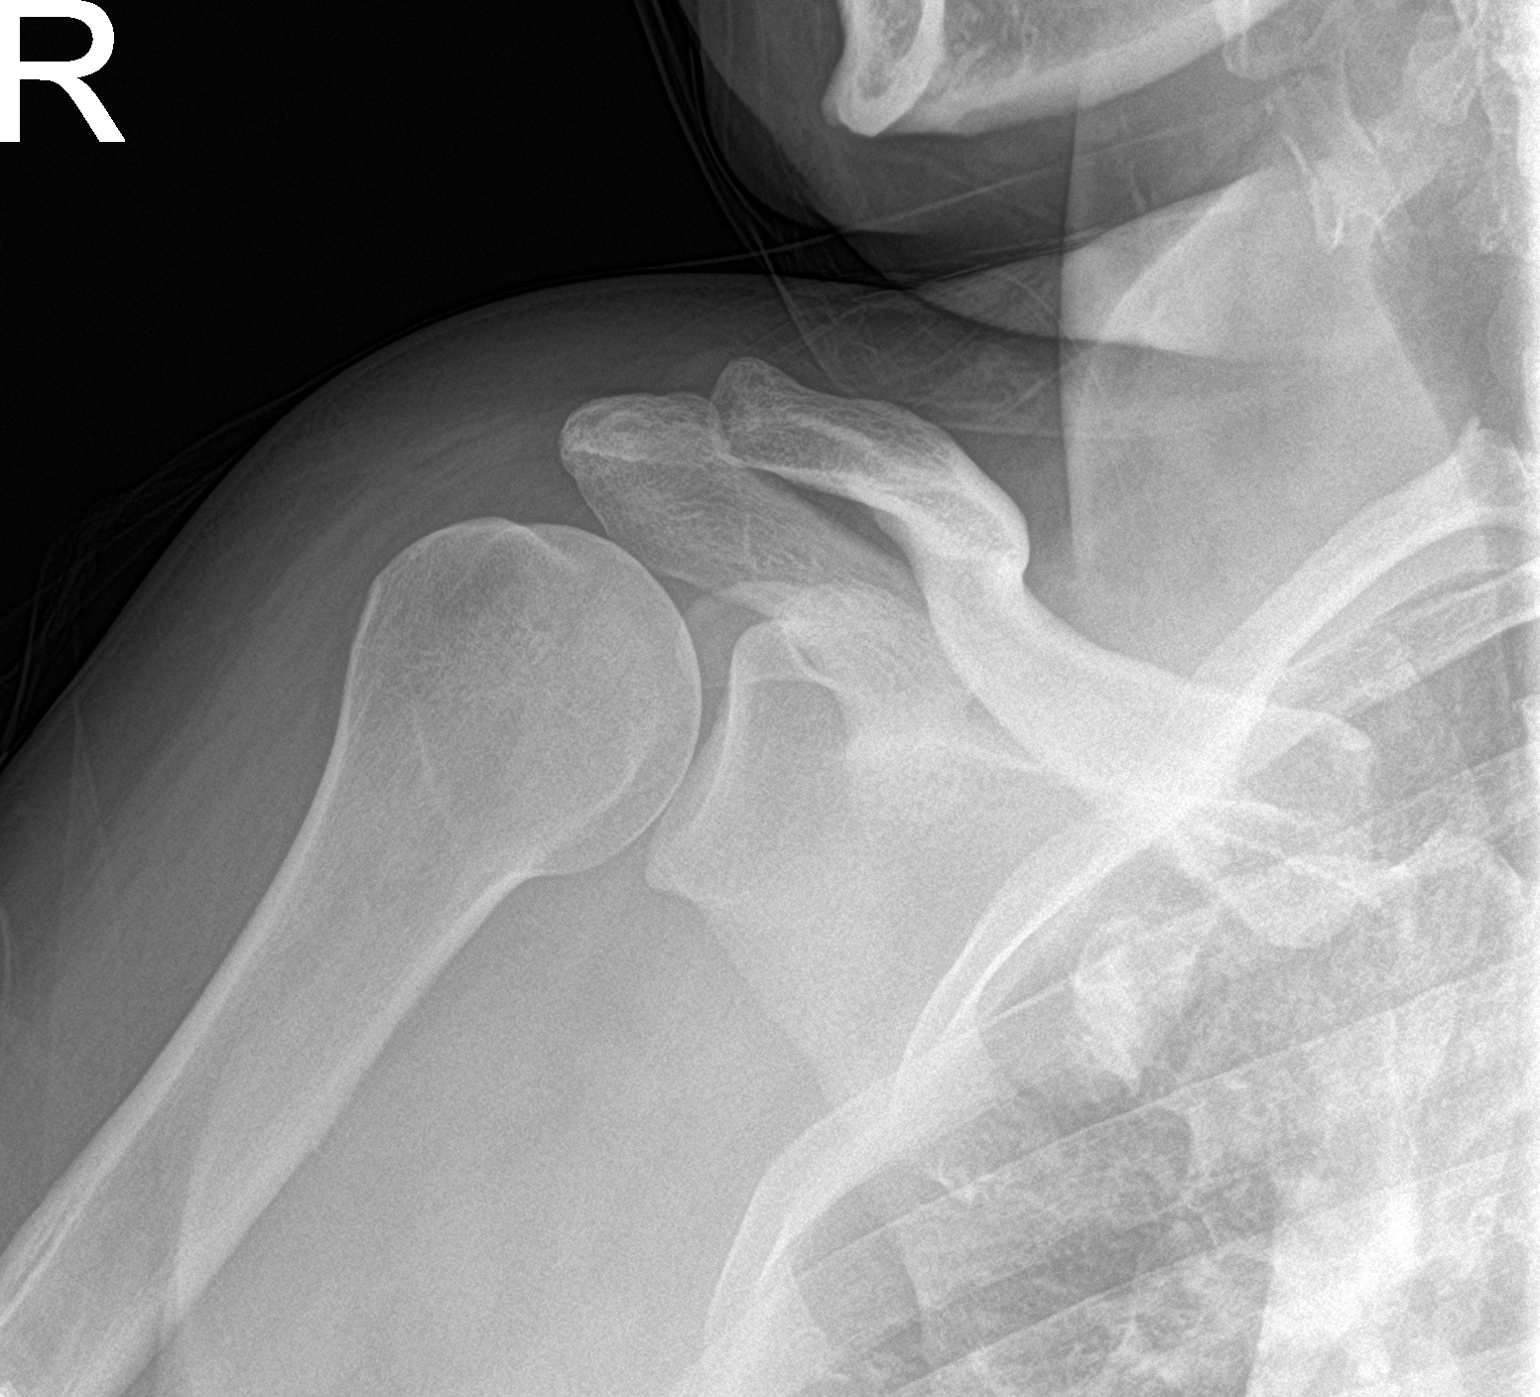

[shoulder y view]
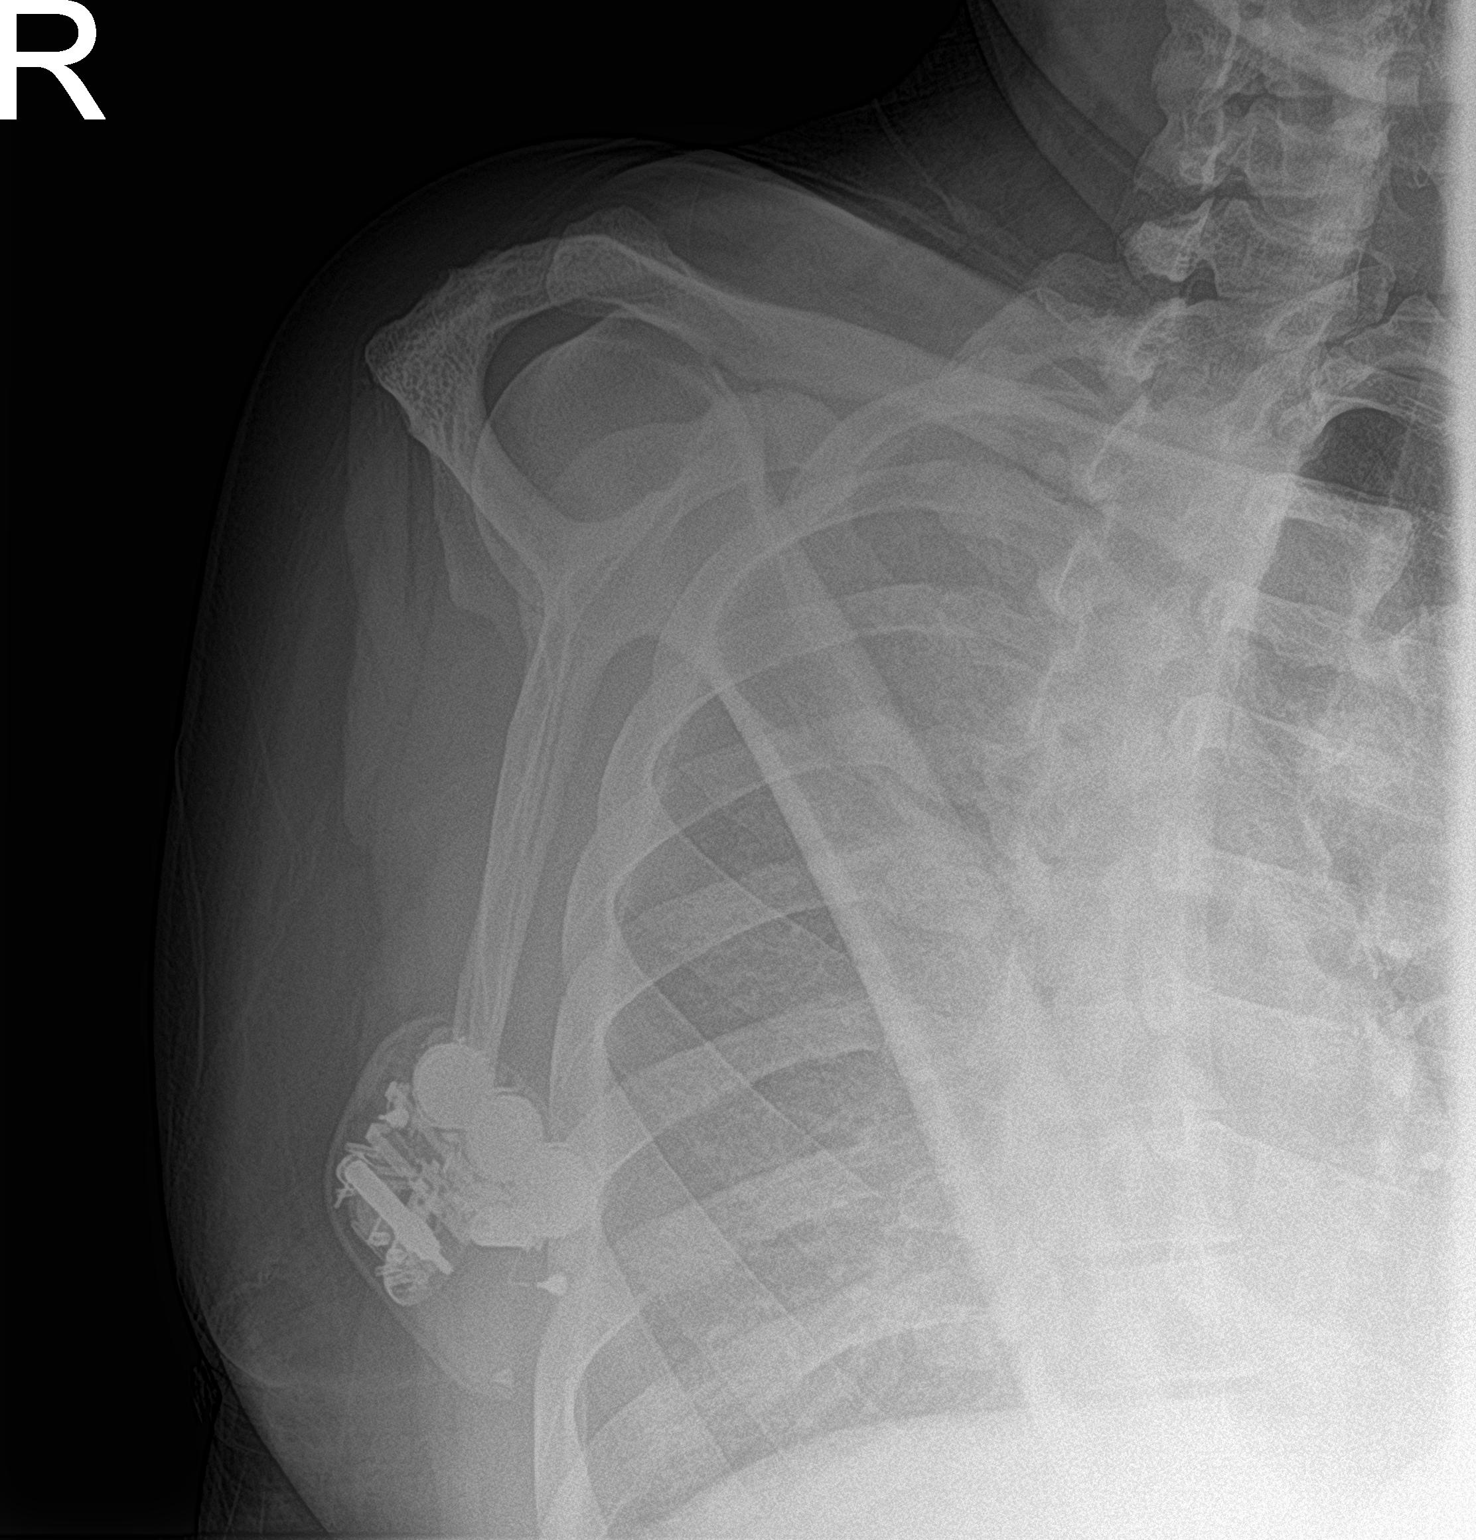

[2 of 2 positions shown; findings below may reference images not displayed]

FINDINGS: There is no evidence of fracture or dislocation. There is no
evidence of arthropathy or other focal bone abnormality. Soft
tissues are unremarkable.
IMPRESSION: Negative.

## 2021-12-05 DIAGNOSIS — Z419 Encounter for procedure for purposes other than remedying health state, unspecified: Secondary | ICD-10-CM | POA: Diagnosis not present

## 2022-01-05 DIAGNOSIS — Z419 Encounter for procedure for purposes other than remedying health state, unspecified: Secondary | ICD-10-CM | POA: Diagnosis not present

## 2022-02-05 DIAGNOSIS — Z419 Encounter for procedure for purposes other than remedying health state, unspecified: Secondary | ICD-10-CM | POA: Diagnosis not present

## 2022-03-07 DIAGNOSIS — Z419 Encounter for procedure for purposes other than remedying health state, unspecified: Secondary | ICD-10-CM | POA: Diagnosis not present

## 2022-04-07 DIAGNOSIS — Z419 Encounter for procedure for purposes other than remedying health state, unspecified: Secondary | ICD-10-CM | POA: Diagnosis not present

## 2022-05-07 DIAGNOSIS — Z419 Encounter for procedure for purposes other than remedying health state, unspecified: Secondary | ICD-10-CM | POA: Diagnosis not present

## 2022-06-07 DIAGNOSIS — Z419 Encounter for procedure for purposes other than remedying health state, unspecified: Secondary | ICD-10-CM | POA: Diagnosis not present

## 2022-07-08 DIAGNOSIS — Z419 Encounter for procedure for purposes other than remedying health state, unspecified: Secondary | ICD-10-CM | POA: Diagnosis not present

## 2022-08-06 DIAGNOSIS — Z419 Encounter for procedure for purposes other than remedying health state, unspecified: Secondary | ICD-10-CM | POA: Diagnosis not present

## 2022-09-06 DIAGNOSIS — Z419 Encounter for procedure for purposes other than remedying health state, unspecified: Secondary | ICD-10-CM | POA: Diagnosis not present

## 2022-09-29 ENCOUNTER — Telehealth: Payer: Self-pay | Admitting: Physician Assistant

## 2022-09-29 NOTE — Progress Notes (Unsigned)
I,Sha'taria Tyson,acting as a Neurosurgeon for [employer], PA-C.,have documented all relevant documentation on the behalf of Alfredia Ferguson, PA-C,as directed by  Alfredia Ferguson, PA-C while in the presence of Alfredia Ferguson, PA-C.   New patient visit   Patient: Mindy Bradford   DOB: November 16, 1985   37 y.o. Female  MRN: [id number] Visit Date: 09/30/2022  Today's healthcare provider: Alfredia Ferguson, PA-C   No chief complaint on file.  Subjective    Mindy Bradford is a 37 y.o. female who presents today as a new patient to establish care.  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   DM2 (diabetes mellitus, type 2) (HCC)    Vaginal Pap smear, abnormal    Past Surgical History:  Procedure Laterality Date   CHOLECYSTECTOMY  2008   DILATION AND EVACUATION N/A 10/17/2020   Procedure: DILATATION AND EVACUATION;  Surgeon: Lesly Dukes, MD;  Location: [facility] LD ORS;  Service: Gynecology;  Laterality: N/A;   LEEP  5 years ago   normal results   WISDOM TOOTH EXTRACTION     age 44   Family Status  Relation Name Status   Mother  Alive   Father  Deceased   Sister  Nature conservation officer   Other  (Not Specified)   Family History  Problem Relation Age of Onset   Asthma Mother    Asthma Sister    ADD / ADHD Sister    ADD / ADHD Brother    Heart disease Other    Cancer Other    Diabetes Other    Hyperlipidemia Other    Hypertension Other    Social History   Socioeconomic History   Marital status: Single    Spouse name: Not on file   Number of children: Not on file   Years of education: Not on file   Highest education level: Not on file  Occupational History   Not on file  Tobacco Use   Smoking status: Every Day    Packs/day: 0.25    Years: 8.00    Additional pack years: 0.00    Total pack years: 2.00    Types: Cigarettes   Smokeless tobacco: Never  Vaping Use   Vaping Use: Never used   Substance and Sexual Activity   Alcohol use: No   Drug use: No   Sexual activity: Yes    Birth control/protection: None    Comment: wants birth control  Other Topics Concern   Not on file  Social History Narrative   Not on file   Social Determinants of Health   Financial Resource Strain: Not on file  Food Insecurity: Food Insecurity Present (12/05/2020)   Hunger Vital Sign    Worried About Running Out of Food in the Last Year: Sometimes true    Ran Out of Food in the Last Year: Sometimes true  Transportation Needs: Unmet Transportation Needs (12/05/2020)   PRAPARE - Administrator, Civil Service (Medical): Yes    Lack of Transportation (Non-Medical): No  Physical Activity: Not on file  Stress: Not on file  Social Connections: Not on file   Outpatient Medications Prior to Visit  Medication Sig   Continuous Blood Gluc Sensor (DEXCOM G6 SENSOR) MISC by Does not apply route.   Insulin Human (INSULIN PUMP) SOLN Inject 3 times daily with meals Correction bolus to be given if CBG reading  between 250 and 300   norgestimate-ethinyl estradiol (ORTHO-CYCLEN) 0.25-35 MG-MCG tablet Take 1 tablet by mouth daily.   No facility-administered medications prior to visit.   Allergies  Allergen Reactions   Sulfa Antibiotics Rash    Immunization History  Administered Date(s) Administered   Influenza Split 02/16/2011   Influenza,inj,Quad PF,6+ Mos 05/18/2016   Pneumococcal Polysaccharide-23 05/18/2016   Tdap 02/16/2011    Health Maintenance  Topic Date Due   COVID-19 Vaccine (1) Never done   FOOT EXAM  Never done   OPHTHALMOLOGY EXAM  Never done   Hepatitis C Screening  Never done   PAP SMEAR-Modifier  12/06/2018   HEMOGLOBIN A1C  08/01/2020   DTaP/Tdap/Td (2 - Td or Tdap) 02/15/2021   Diabetic kidney evaluation - eGFR measurement  10/16/2021   Diabetic kidney evaluation - Urine ACR  10/16/2021   INFLUENZA VACCINE  01/06/2023   HIV Screening  Completed   HPV VACCINES   Aged Out    Patient Care Team: Pcp, No as PCP - General  Review of Systems  {Labs  Heme  Chem  Endocrine  Serology  Results Review (optional):23779}   Objective    There were no vitals taken for this visit. {Show previous vital signs (optional):23777}  Physical Exam ***  Depression Screen    12/05/2020   10:34 AM 10/04/2019   11:09 AM 05/18/2016   10:11 AM  PHQ 2/9 Scores  PHQ - 2 Score 0 0 0  PHQ- 9 Score 0     No results found for any visits on 09/30/22.  Assessment & Plan     ***  No follow-ups on file.     {provider attestation***:1}   Alfredia Ferguson, PA-C  [facility] [phone] (phone) [phone] (fax)  [facility]

## 2022-09-30 ENCOUNTER — Encounter: Payer: Self-pay | Admitting: Physician Assistant

## 2022-09-30 ENCOUNTER — Telehealth: Payer: Self-pay | Admitting: Physician Assistant

## 2022-09-30 ENCOUNTER — Telehealth: Payer: Self-pay

## 2022-09-30 ENCOUNTER — Ambulatory Visit (INDEPENDENT_AMBULATORY_CARE_PROVIDER_SITE_OTHER): Payer: Medicaid Other | Admitting: Physician Assistant

## 2022-09-30 VITALS — BP 123/75 | HR 85 | Ht 67.0 in | Wt 228.9 lb

## 2022-09-30 DIAGNOSIS — Z720 Tobacco use: Secondary | ICD-10-CM

## 2022-09-30 DIAGNOSIS — Z23 Encounter for immunization: Secondary | ICD-10-CM

## 2022-09-30 DIAGNOSIS — Z111 Encounter for screening for respiratory tuberculosis: Secondary | ICD-10-CM

## 2022-09-30 DIAGNOSIS — Z8639 Personal history of other endocrine, nutritional and metabolic disease: Secondary | ICD-10-CM

## 2022-09-30 DIAGNOSIS — Z30011 Encounter for initial prescription of contraceptive pills: Secondary | ICD-10-CM

## 2022-09-30 DIAGNOSIS — Z8759 Personal history of other complications of pregnancy, childbirth and the puerperium: Secondary | ICD-10-CM | POA: Diagnosis not present

## 2022-09-30 DIAGNOSIS — Z0000 Encounter for general adult medical examination without abnormal findings: Secondary | ICD-10-CM

## 2022-09-30 DIAGNOSIS — E010 Iodine-deficiency related diffuse (endemic) goiter: Secondary | ICD-10-CM | POA: Diagnosis not present

## 2022-09-30 DIAGNOSIS — Z124 Encounter for screening for malignant neoplasm of cervix: Secondary | ICD-10-CM | POA: Diagnosis not present

## 2022-09-30 DIAGNOSIS — Z794 Long term (current) use of insulin: Secondary | ICD-10-CM | POA: Diagnosis not present

## 2022-09-30 DIAGNOSIS — E1169 Type 2 diabetes mellitus with other specified complication: Secondary | ICD-10-CM | POA: Diagnosis not present

## 2022-09-30 DIAGNOSIS — E1165 Type 2 diabetes mellitus with hyperglycemia: Secondary | ICD-10-CM | POA: Diagnosis not present

## 2022-09-30 LAB — POCT GLYCOSYLATED HEMOGLOBIN (HGB A1C): Hemoglobin A1C: 8 % — AB (ref 4.0–5.6)

## 2022-09-30 MED ORDER — NORGESTIMATE-ETH ESTRADIOL 0.25-35 MG-MCG PO TABS
1.0000 | ORAL_TABLET | Freq: Every day | ORAL | 1 refills | Status: AC
Start: 2022-09-30 — End: ?

## 2022-09-30 MED ORDER — DEXCOM G7 SENSOR MISC
0 refills | Status: DC
Start: 2022-09-30 — End: 2022-11-08

## 2022-09-30 MED ORDER — TIRZEPATIDE 2.5 MG/0.5ML ~~LOC~~ SOAJ
2.5000 mg | SUBCUTANEOUS | 0 refills | Status: DC
Start: 2022-09-30 — End: 2022-10-19

## 2022-09-30 NOTE — Telephone Encounter (Signed)
Mindy Bradford (Key: [id number]) Rx #: [id number] Need Help? Call us at [phone] Outcome Additional Information Required Prior Authorization Not Required

## 2022-09-30 NOTE — Assessment & Plan Note (Signed)
Used to be insulin dependent + trulicity. Pt has been without all meds for quite a while Poc A1c today 8.0%, reassuring. Advised starting w/ labs, re-ordered dexcom but advised since she is not actively on insulin they may deny. No samples today.  She has glucometer, encouraged daily fasting levels.  Would like to get her back on GLP -1 ; will try for mounjaro as she has a history of trulicity and ozempic use previously.  Uacr ordered Foot exam deferred, no neuropathy symptoms reported today. Given history, referring to endo but lower than anticipated A1c is reassuring that if she cannot get in to see endo that we can manage

## 2022-09-30 NOTE — Assessment & Plan Note (Signed)
Unmotivated currently Encouraged smoking cessation

## 2022-09-30 NOTE — Telephone Encounter (Signed)
[facility] pharmacy requesting prior authorization Key: [id number] Name: Mindy Bradford 2.5mg /0.40ml pen injectors

## 2022-10-02 LAB — QUANTIFERON-TB GOLD PLUS

## 2022-10-04 ENCOUNTER — Encounter: Payer: Self-pay | Admitting: Physician Assistant

## 2022-10-04 LAB — MICROALBUMIN / CREATININE URINE RATIO
Creatinine, Urine: 88.6 mg/dL
Microalb/Creat Ratio: 6 mg/g creat (ref 0–29)
Microalbumin, Urine: 5.6 ug/mL

## 2022-10-04 LAB — CBC WITH DIFFERENTIAL/PLATELET
Basophils Absolute: 0 10*3/uL (ref 0.0–0.2)
Basos: 0 %
EOS (ABSOLUTE): 0.1 10*3/uL (ref 0.0–0.4)
Eos: 1 %
Hematocrit: 47.3 % — ABNORMAL HIGH (ref 34.0–46.6)
Hemoglobin: 15.5 g/dL (ref 11.1–15.9)
Immature Grans (Abs): 0.1 10*3/uL (ref 0.0–0.1)
Immature Granulocytes: 1 %
Lymphocytes Absolute: 2.4 10*3/uL (ref 0.7–3.1)
Lymphs: 25 %
MCH: 28.2 pg (ref 26.6–33.0)
MCHC: 32.8 g/dL (ref 31.5–35.7)
MCV: 86 fL (ref 79–97)
Monocytes Absolute: 0.4 10*3/uL (ref 0.1–0.9)
Monocytes: 4 %
Neutrophils Absolute: 6.6 10*3/uL (ref 1.4–7.0)
Neutrophils: 69 %
Platelets: 267 10*3/uL (ref 150–450)
RBC: 5.49 x10E6/uL — ABNORMAL HIGH (ref 3.77–5.28)
RDW: 12.1 % (ref 11.7–15.4)
WBC: 9.6 10*3/uL (ref 3.4–10.8)

## 2022-10-04 LAB — COMPREHENSIVE METABOLIC PANEL
ALT: 27 IU/L (ref 0–32)
AST: 16 IU/L (ref 0–40)
Albumin/Globulin Ratio: 1.5 (ref 1.2–2.2)
Albumin: 4.3 g/dL (ref 3.9–4.9)
Alkaline Phosphatase: 87 IU/L (ref 44–121)
BUN/Creatinine Ratio: 15 (ref 9–23)
BUN: 9 mg/dL (ref 6–20)
Bilirubin Total: 1.1 mg/dL (ref 0.0–1.2)
CO2: 22 mmol/L (ref 20–29)
Calcium: 9.9 mg/dL (ref 8.7–10.2)
Chloride: 102 mmol/L (ref 96–106)
Creatinine, Ser: 0.62 mg/dL (ref 0.57–1.00)
Globulin, Total: 2.9 g/dL (ref 1.5–4.5)
Glucose: 262 mg/dL — ABNORMAL HIGH (ref 70–99)
Potassium: 5.1 mmol/L (ref 3.5–5.2)
Sodium: 139 mmol/L (ref 134–144)
Total Protein: 7.2 g/dL (ref 6.0–8.5)
eGFR: 118 mL/min/{1.73_m2} (ref >59)

## 2022-10-04 LAB — QUANTIFERON-TB GOLD PLUS
QuantiFERON Mitogen Value: >10 IU/mL
QuantiFERON Nil Value: 0.01 IU/mL
QuantiFERON TB1 Ag Value: 0.01 IU/mL

## 2022-10-04 LAB — LIPID PANEL
Chol/HDL Ratio: 3.9 ratio (ref 0.0–4.4)
Cholesterol, Total: 135 mg/dL (ref 100–199)
HDL: 35 mg/dL — ABNORMAL LOW (ref >39)
LDL Chol Calc (NIH): 71 mg/dL (ref 0–99)
Triglycerides: 169 mg/dL — ABNORMAL HIGH (ref 0–149)
VLDL Cholesterol Cal: 29 mg/dL (ref 5–40)

## 2022-10-04 LAB — TSH+FREE T4
Free T4: 1.23 ng/dL (ref 0.82–1.77)
TSH: 0.351 u[IU]/mL — ABNORMAL LOW (ref 0.450–4.500)

## 2022-10-05 ENCOUNTER — Other Ambulatory Visit: Payer: Self-pay | Admitting: Physician Assistant

## 2022-10-05 ENCOUNTER — Other Ambulatory Visit: Payer: Self-pay

## 2022-10-05 ENCOUNTER — Ambulatory Visit
Admission: RE | Admit: 2022-10-05 | Discharge: 2022-10-05 | Disposition: A | Discharge status: Home / Self Care | Payer: Medicaid Other | Source: Ambulatory Visit | Attending: Physician Assistant | Admitting: Physician Assistant

## 2022-10-05 DIAGNOSIS — Z8639 Personal history of other endocrine, nutritional and metabolic disease: Secondary | ICD-10-CM | POA: Diagnosis not present

## 2022-10-05 DIAGNOSIS — E010 Iodine-deficiency related diffuse (endemic) goiter: Secondary | ICD-10-CM | POA: Diagnosis not present

## 2022-10-05 DIAGNOSIS — E041 Nontoxic single thyroid nodule: Secondary | ICD-10-CM

## 2022-10-05 NOTE — Progress Notes (Signed)
Order no longer needed.

## 2022-10-06 DIAGNOSIS — Z419 Encounter for procedure for purposes other than remedying health state, unspecified: Secondary | ICD-10-CM | POA: Diagnosis not present

## 2022-10-11 ENCOUNTER — Encounter: Payer: Self-pay | Admitting: Physician Assistant

## 2022-10-13 ENCOUNTER — Other Ambulatory Visit: Payer: Self-pay | Admitting: Physician Assistant

## 2022-10-13 DIAGNOSIS — E1165 Type 2 diabetes mellitus with hyperglycemia: Secondary | ICD-10-CM

## 2022-10-13 MED ORDER — DEXCOM G7 RECEIVER DEVI
0 refills | Status: AC
Start: 2022-10-13 — End: ?

## 2022-10-18 ENCOUNTER — Other Ambulatory Visit: Payer: Self-pay | Admitting: Physician Assistant

## 2022-10-19 ENCOUNTER — Encounter: Payer: Self-pay | Admitting: Physician Assistant

## 2022-10-19 ENCOUNTER — Encounter: Payer: Self-pay | Admitting: "Endocrinology

## 2022-10-19 ENCOUNTER — Ambulatory Visit (INDEPENDENT_AMBULATORY_CARE_PROVIDER_SITE_OTHER): Payer: Medicaid Other | Admitting: "Endocrinology

## 2022-10-19 ENCOUNTER — Other Ambulatory Visit: Payer: Self-pay

## 2022-10-19 VITALS — BP 120/80 | HR 77 | Ht 67.0 in | Wt 226.0 lb

## 2022-10-19 DIAGNOSIS — E782 Mixed hyperlipidemia: Secondary | ICD-10-CM

## 2022-10-19 DIAGNOSIS — E0590 Thyrotoxicosis, unspecified without thyrotoxic crisis or storm: Secondary | ICD-10-CM | POA: Diagnosis not present

## 2022-10-19 DIAGNOSIS — E1165 Type 2 diabetes mellitus with hyperglycemia: Secondary | ICD-10-CM | POA: Diagnosis not present

## 2022-10-19 DIAGNOSIS — Z7985 Long-term (current) use of injectable non-insulin antidiabetic drugs: Secondary | ICD-10-CM

## 2022-10-19 MED ORDER — TIRZEPATIDE 5 MG/0.5ML ~~LOC~~ SOAJ: 5.0000 mg | SUBCUTANEOUS | 0 refills | Status: AC

## 2022-10-19 MED ORDER — METFORMIN HCL 500 MG PO TABS: 1000.0000 mg | ORAL_TABLET | Freq: Two times a day (BID) | ORAL | 2 refills | Status: AC

## 2022-10-19 NOTE — Progress Notes (Signed)
Outpatient Endocrinology Note Mindy Cape Neddick, MD  10/19/22   Mindy Bradford 07/19/1985 [id number]  Referring Provider: Alfredia Ferguson, PA-C Primary Care Provider: Alfredia Ferguson, PA-C Reason for consultation: Subjective   Assessment & Plan  Diagnoses and all orders for this visit:  Uncontrolled type 2 diabetes mellitus with hyperglycemia (HCC)  Long-term (current) use of injectable non-insulin antidiabetic drugs  Mixed hypercholesterolemia and hypertriglyceridemia  Subclinical hyperthyroidism  Other orders -     tirzepatide (MOUNJARO) 5 MG/0.5ML Pen; Inject 5 mg into the skin once a week. -     metFORMIN (GLUCOPHAGE) 500 MG tablet; Take 2 tablets (1,000 mg total) by mouth 2 (two) times daily with a meal.    Diabetes complicated by hyperglycemia Hba1c goal less than 7.0, current Hba1c is 8. Will recommend for the following change of medications to: Mounjaro 5 mg weekly Start Metformin 500 mg escalating dose up to 1000 mg bid   No known contraindications to any of above medications No history of MEN syndrome/medullary thyroid cancer/pancreatitis or pancreatic cancer in self or family  Educated on risks and side effects of mounjaro including but not limited to pancreatitis, pancreatic cancer and medullary thyroid cancer in mice. Patient verbalized adequate understanding.    Hyperlipidemia -Last LDL at goal: 40 -not on statin -Follow low fat diet and exercise   Subclinical hyperthyroidism TSH 0.35, FT4 1.2 No treatment indicated  -Blood pressure goal <140/90 - Microalbumin/creatinine goal < 30 -not on ACE/ARB -diet changes including salt restriction -limit eating outside -counseled BP targets per standards of diabetes care -Uncontrolled blood pressure can lead to retinopathy, nephropathy and cardiovascular and atherosclerotic heart disease  Reviewed and counseled on: -A1C target -Blood sugar targets -Complications of uncontrolled diabetes  -Checking  blood sugar before meals and bedtime and bring log next visit -All medications with mechanism of action and side effects -Hypoglycemia management: rule of 15's, Glucagon Emergency Kit and medical alert ID -low-carb low-fat plate-method diet -At least 20 minutes of physical activity per day -Annual dilated retinal eye exam and foot exam -compliance and follow up needs -follow up as scheduled or earlier if problem gets worse  Call if blood sugar is less than 70 or consistently above 250    Take a 15 gm snack of carbohydrate at bedtime before you go to sleep if your blood sugar is less than 100.    If you are going to fast after midnight for a test or procedure, ask your physician for instructions on how to reduce/decrease your insulin dose.    Call if blood sugar is less than 70 or consistently above 250  -Treating a low sugar by rule of 15  (15 gms of sugar every 15 min until sugar is more than 70) If you feel your sugar is low, test your sugar to be sure If your sugar is low (less than 70), then take 15 grams of a fast acting Carbohydrate (3-4 glucose tablets or glucose gel or 4 ounces of juice or regular soda) Recheck your sugar 15 min after treating low to make sure it is more than 70 If sugar is still less than 70, treat again with 15 grams of carbohydrate          Don't drive the hour of hypoglycemia  If unconscious/unable to eat or drink by mouth, use glucagon injection or nasal spray baqsimi and call 911. Can repeat again in 15 min if still unconscious.  Return in about 3 months (around 01/19/2023).   I  have reviewed current medications, nurse's notes, allergies, vital signs, past medical and surgical history, family medical history, and social history for this encounter. Counseled patient on symptoms, examination findings, lab findings, imaging results, treatment decisions and monitoring and prognosis. The patient understood the recommendations and agrees with the treatment plan.  All questions regarding treatment plan were fully answered.  Mindy [place], MD  10/19/22    History of Present Illness Mindy Bradford is a 37 y.o. year old female who presents for evaluation of Type 2 diabetes mellitus.  Mindy Bradford was first diagnosed in 2021 as gestational DM.    Home diabetes regimen: Mounjaro 2.5 mg weekly   Has taken insulin pump and humalog before   COMPLICATIONS -  MI/Stroke -  retinopathy, last eye exam long ago -  neuropathy -  nephropathy  SYMPTOMS REVIEWED - Polyuria - Weight loss - Blurred vision  BLOOD SUGAR DATA 0-1 times a day 89-161 Accucheck Guide  Physical Exam  BP 120/80 (BP Location: Left Arm, Patient Position: Sitting, Cuff Size: Normal)   Pulse 77   Ht 5\' 7"  (1.702 m)   Wt 226 lb (102.5 kg)   LMP 09/10/2022 (Within Days)   SpO2 98%   BMI 35.40 kg/m    Constitutional: well developed, well nourished Head: normocephalic, atraumatic Eyes: sclera anicteric, no redness Neck: supple Lungs: normal respiratory effort Neurology: alert and oriented Skin: dry, no appreciable rashes Musculoskeletal: no appreciable defects Psychiatric: normal mood and affect Diabetic Foot Exam - Simple   Simple Foot Form Diabetic Foot exam was performed with the following findings: Yes 10/19/2022  1:39 PM  Visual Inspection No deformities, no ulcerations, no other skin breakdown bilaterally: Yes Sensation Testing Intact to touch and monofilament testing bilaterally: Yes Pulse Check Posterior Tibialis and Dorsalis pulse intact bilaterally: Yes Comments      Current Medications Patient's Medications  New Prescriptions   METFORMIN (GLUCOPHAGE) 500 MG TABLET    Take 2 tablets (1,000 mg total) by mouth 2 (two) times daily with a meal.   TIRZEPATIDE (MOUNJARO) 5 MG/0.5ML PEN    Inject 5 mg into the skin once a week.  Previous Medications   CONTINUOUS GLUCOSE RECEIVER (DEXCOM G7 RECEIVER) DEVI    Use with dexom sensors to continuously  monitor blood sugar   CONTINUOUS GLUCOSE SENSOR (DEXCOM G7 SENSOR) MISC    Use to monitor blood sugar continuously , change every 10 days   CONTINUOUS GLUCOSE TRANSMITTER (DEXCOM G6 TRANSMITTER) MISC    USE AS DIRECTED TO MONITOR GLUCOSE LEVELS. REUSE TRANSMITTER FOR 90 DAYS THEN DISCARD AND REPLACE.   INSULIN HUMAN (INSULIN PUMP) SOLN    Inject 3 times daily with meals Correction bolus to be given if CBG reading between 250 and 300   NORGESTIMATE-ETHINYL ESTRADIOL (ORTHO-CYCLEN) 0.25-35 MG-MCG TABLET    Take 1 tablet by mouth daily.  Modified Medications   No medications on file  Discontinued Medications   TIRZEPATIDE (MOUNJARO) 2.5 MG/0.5ML PEN    Inject 2.5 mg into the skin once a week.    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   DM2 (diabetes mellitus, type 2) (HCC)    Vaginal Pap smear, abnormal     Past Surgical History Past Surgical History:  Procedure Laterality Date   CHOLECYSTECTOMY  2008   DILATION AND EVACUATION N/A  10/17/2020   Procedure: DILATATION AND EVACUATION;  Surgeon: Lesly Dukes, MD;  Location: [facility] LD [facility];  Service: Gynecology;  Laterality: N/A;   LEEP  5 years ago   normal results   WISDOM TOOTH EXTRACTION     age 54    Family History family history includes ADD / ADHD in her brother and sister; Asthma in her mother and sister; Cancer in an other family member; Diabetes in an other family member; Heart disease in an other family member; Hyperlipidemia in an other family member; Hypertension in an other family member.  Social History Social History   Socioeconomic History   Marital status: Single    Spouse name: Not on file   Number of children: 4   Years of education: Not on file   Highest education level: Not on file  Occupational History   Not on file   Tobacco Use   Smoking status: Every Day    Packs/day: 0.50    Years: 10.00    Additional pack years: 0.00    Total pack years: 5.00    Types: Cigarettes   Smokeless tobacco: Never  Vaping Use   Vaping Use: Never used  Substance and Sexual Activity   Alcohol use: No   Drug use: No   Sexual activity: Yes    Birth control/protection: None    Comment: wants birth control  Other Topics Concern   Not on file  Social History Narrative   Not on file   Social Determinants of Health   Financial Resource Strain: Not on file  Food Insecurity: Food Insecurity Present (12/05/2020)   Hunger Vital Sign    Worried About Running Out of Food in the Last Year: Sometimes true    Ran Out of Food in the Last Year: Sometimes true  Transportation Needs: Unmet Transportation Needs (12/05/2020)   PRAPARE - Administrator, Civil Service (Medical): Yes    Lack of Transportation (Non-Medical): No  Physical Activity: Not on file  Stress: Not on file  Social Connections: Not on file  Intimate Partner Violence: Not on file    Lab Results  Component Value Date   HGBA1C 8.0 (A) 09/30/2022   Lab Results  Component Value Date   CHOL 135 09/30/2022   Lab Results  Component Value Date   HDL 35 (L) 09/30/2022   Lab Results  Component Value Date   LDLCALC 71 09/30/2022   Lab Results  Component Value Date   TRIG 169 (H) 09/30/2022   Lab Results  Component Value Date   CHOLHDL 3.9 09/30/2022   Lab Results  Component Value Date   CREATININE 0.62 09/30/2022   No results found for: "GFR" No results found for: "MICROALBUR", "MALB24HUR"    Component Value Date/Time   NA 139 09/30/2022 1017   K 5.1 09/30/2022 1017   CL 102 09/30/2022 1017   CO2 22 09/30/2022 1017   GLUCOSE 262 (H) 09/30/2022 1017   GLUCOSE 113 (H) 10/16/2020 0410   BUN 9 09/30/2022 1017   CREATININE 0.62 09/30/2022 1017   CALCIUM 9.9 09/30/2022 1017   PROT 7.2 09/30/2022 1017   ALBUMIN 4.3 09/30/2022 1017    AST 16 09/30/2022 1017   ALT 27 09/30/2022 1017   ALKPHOS 87 09/30/2022 1017   BILITOT 1.1 09/30/2022 1017   GFRNONAA >60 10/16/2020 0410   GFRAA >60 04/22/2016 1026      Latest Ref Rng & Units 09/30/2022   10:17 AM 10/17/2020    9:42  AM 10/16/2020    4:10 AM  BMP  Glucose 70 - 99 mg/dL 161   096   BUN 6 - 20 mg/dL 9   <5   Creatinine 0.45 - 1.00 mg/dL 4.09   8.11   BUN/Creat Ratio 9 - 23 15     Sodium 134 - 144 mmol/L 139  140  135   Potassium 3.5 - 5.2 mmol/L 5.1  4.2  4.2   Chloride 96 - 106 mmol/L 102   107   CO2 20 - 29 mmol/L 22   20   Calcium 8.7 - 10.2 mg/dL 9.9   8.5        Component Value Date/Time   WBC 9.6 09/30/2022 1017   WBC 8.0 10/18/2020 0407   RBC 5.49 (H) 09/30/2022 1017   RBC 2.86 (L) 10/18/2020 0407   HGB 15.5 09/30/2022 1017   HCT 47.3 (H) 09/30/2022 1017   PLT 267 09/30/2022 1017   MCV 86 09/30/2022 1017   MCH 28.2 09/30/2022 1017   MCH 27.3 10/18/2020 0407   MCHC 32.8 09/30/2022 1017   MCHC 33.1 10/18/2020 0407   RDW 12.1 09/30/2022 1017   LYMPHSABS 2.4 09/30/2022 1017   MONOABS 0.7 02/11/2011 0933   EOSABS 0.1 09/30/2022 1017   BASOSABS 0.0 09/30/2022 1017     Parts of this note may have been dictated using voice recognition software. There may be variances in spelling and vocabulary which are unintentional. Not all errors are proofread. Please notify the Thereasa Parkin if any discrepancies are noted or if the meaning of any statement is not clear.

## 2022-10-20 MED ORDER — INSULIN PUMP: SUBCUTANEOUS | 11 refills | Status: AC

## 2022-10-22 ENCOUNTER — Other Ambulatory Visit (HOSPITAL_COMMUNITY): Payer: Self-pay

## 2022-10-22 ENCOUNTER — Telehealth: Payer: Self-pay | Admitting: "Endocrinology

## 2022-10-22 NOTE — Telephone Encounter (Signed)
Patient called to advise that she will be taking her last dose of Mounjaro tomorrow and that her RX requires a Prior Authorization

## 2022-10-25 ENCOUNTER — Telehealth: Payer: Self-pay

## 2022-10-25 ENCOUNTER — Other Ambulatory Visit (HOSPITAL_COMMUNITY): Payer: Self-pay

## 2022-10-25 NOTE — Telephone Encounter (Signed)
Patient Advocate Encounter   Received notification from pt msgs that prior authorization is required for [facility]  Submitted: 10/25/22 Key [id number]  Status is pending

## 2022-11-03 ENCOUNTER — Telehealth: Payer: Self-pay | Admitting: "Endocrinology

## 2022-11-03 NOTE — Telephone Encounter (Signed)
Patient is calling to check on the status of the prior authorization for the prescription for  tirzepatide tirzepatide [facility]) 5 MG/0.5ML Pen.  Patient states that she will be due for another dose this Saturday 11/06/2022.

## 2022-11-04 ENCOUNTER — Other Ambulatory Visit (HOSPITAL_COMMUNITY): Payer: Self-pay

## 2022-11-04 NOTE — Telephone Encounter (Signed)
Apparently there's an issue with the pt's ins and it's not allowing [place] to submit it via CMM, even though it looks like it does. Called to get the form to request a new PA for the 5mg  strength. Even though the current PA approval letter doesn't have a strength on it, it was approved for the 2.5mg , so a new PA has to be requested for the 5mg  and they will not do the PA over the phone.

## 2022-11-04 NOTE — Telephone Encounter (Signed)
completed

## 2022-11-04 NOTE — Telephone Encounter (Signed)
Pharmacy Patient Advocate Encounter  [facility] is unable to complete PA. Called to find out that the pt has a PA on file for 2.5mg , but a new one must be submitted for 5mg . They only accept fax PA requests. She sent me the form to fill out. Filled out form and current office notes faxed to [facility] [phone].  Will update encounter as we receive determination.

## 2022-11-06 DIAGNOSIS — Z419 Encounter for procedure for purposes other than remedying health state, unspecified: Secondary | ICD-10-CM | POA: Diagnosis not present

## 2022-11-08 ENCOUNTER — Other Ambulatory Visit: Payer: Self-pay | Admitting: Physician Assistant

## 2022-11-08 DIAGNOSIS — E1165 Type 2 diabetes mellitus with hyperglycemia: Secondary | ICD-10-CM

## 2022-11-16 ENCOUNTER — Telehealth: Payer: Self-pay

## 2022-11-16 NOTE — Telephone Encounter (Signed)
Toneshia calling asking if we have heard from her insurance regarding the mounjaro , I spoke to Bigelow and she stated nothing has came trough from insurance yet, advised her to contact her insurance for follow up

## 2022-11-21 NOTE — Progress Notes (Unsigned)
PCP:  Alfredia Ferguson, PA-C   No chief complaint on file.    HPI:      Ms. Mindy Bradford is a 37 y.o. [phone] whose LMP was No LMP recorded., presents today for her annual examination.  Her menses are {norm/abn:715}, lasting {number: 22536} days.  Dysmenorrhea {dysmen:716}. She {does:18564} have intermenstrual bleeding.  Sex activity: {sex active: 315163}.  Last Pap: not recent Hx of STDs: {STD hx:14358}  There is no FH of breast cancer. There is no FH of ovarian cancer. The patient {does:18564} do self-breast exams.  Tobacco use: {tob:20664} Alcohol use: {Alcohol:11675} No drug use.  Exercise: {exercise:31265}  She {does:18564} get adequate calcium and Vitamin D in her diet.  Patient Active Problem List   Diagnosis Date Noted   Tobacco use 09/30/2022   History of molar pregnancy 09/30/2022   DM2 (diabetes mellitus, type 2) (HCC) 12/05/2020    Past Surgical History:  Procedure Laterality Date   CHOLECYSTECTOMY  2008   DILATION AND EVACUATION N/A 10/17/2020   Procedure: DILATATION AND EVACUATION;  Surgeon: Lesly Dukes, MD;  Location: [facility] LD ORS;  Service: Gynecology;  Laterality: N/A;   LEEP  5 years ago   normal results   WISDOM TOOTH EXTRACTION     age 71    Family History  Problem Relation Age of Onset   Asthma Mother    Asthma Sister    ADD / ADHD Sister    ADD / ADHD Brother    Heart disease Other    Cancer Other    Diabetes Other    Hyperlipidemia Other    Hypertension Other     Social History   Socioeconomic History   Marital status: Single    Spouse name: Not on file   Number of children: 4   Years of education: Not on file   Highest education level: Not on file  Occupational History   Not on file  Tobacco Use   Smoking status: Every Day    Packs/day: 0.50    Years: 10.00    Additional pack years: 0.00    Total pack years: 5.00    Types: Cigarettes   Smokeless tobacco: Never  Vaping Use   Vaping Use: Never used  Substance and  Sexual Activity   Alcohol use: No   Drug use: No   Sexual activity: Yes    Birth control/protection: None    Comment: wants birth control  Other Topics Concern   Not on file  Social History Narrative   Not on file   Social Determinants of Health   Financial Resource Strain: Not on file  Food Insecurity: Food Insecurity Present (12/05/2020)   Hunger Vital Sign    Worried About Running Out of Food in the Last Year: Sometimes true    Ran Out of Food in the Last Year: Sometimes true  Transportation Needs: Unmet Transportation Needs (12/05/2020)   PRAPARE - Administrator, Civil Service (Medical): Yes    Lack of Transportation (Non-Medical): No  Physical Activity: Not on file  Stress: Not on file  Social Connections: Not on file  Intimate Partner Violence: Not on file     Current Outpatient Medications:    Continuous Glucose Receiver (DEXCOM G7 RECEIVER) DEVI, Use with dexom sensors to continuously monitor blood sugar, Disp: 1 each, Rfl: 0   Continuous Glucose Sensor (DEXCOM G7 SENSOR) MISC, USE TO MONITOR BLOOD SUGAR CONTINUOUSLY, CHANGE EVERY 10 DAYS., Disp: 3 each, Rfl: 3  Continuous Glucose Transmitter (DEXCOM G6 TRANSMITTER) MISC, USE AS DIRECTED TO MONITOR GLUCOSE LEVELS. REUSE TRANSMITTER FOR 90 DAYS THEN DISCARD AND REPLACE., Disp: 1 each, Rfl: 1   Insulin Human (INSULIN PUMP) SOLN, Inject 3 times daily with meals Correction bolus to be given if CBG reading between 250 and 300, Disp: 1 each, Rfl: 11   metFORMIN (GLUCOPHAGE) 500 MG tablet, Take 2 tablets (1,000 mg total) by mouth 2 (two) times daily with a meal., Disp: 120 tablet, Rfl: 2   norgestimate-ethinyl estradiol (ORTHO-CYCLEN) 0.25-35 MG-MCG tablet, Take 1 tablet by mouth daily., Disp: 84 tablet, Rfl: 1   tirzepatide (MOUNJARO) 5 MG/0.5ML Pen, Inject 5 mg into the skin once a week., Disp: 6 mL, Rfl: 0     ROS:  Review of Systems BREAST: No symptoms   Objective: There were no vitals taken for this  visit.   OBGyn Exam  Results: No results found for this or any previous visit (from the past 24 hour(s)).  Assessment/Plan: No diagnosis found.  No orders of the defined types were placed in this encounter.            GYN counsel {counseling: 16159}     F/U  No follow-ups on file.  Ladan Vanderzanden B. Ireland Virrueta, PA-C 11/21/2022 9:42 AM

## 2022-11-22 ENCOUNTER — Ambulatory Visit (INDEPENDENT_AMBULATORY_CARE_PROVIDER_SITE_OTHER): Payer: Medicaid Other | Admitting: Obstetrics and Gynecology

## 2022-11-22 ENCOUNTER — Encounter: Payer: Self-pay | Admitting: Obstetrics and Gynecology

## 2022-11-22 ENCOUNTER — Other Ambulatory Visit (HOSPITAL_COMMUNITY)
Admission: RE | Admit: 2022-11-22 | Discharge: 2022-11-22 | Disposition: A | Discharge status: Home / Self Care | Payer: Medicaid Other | Source: Ambulatory Visit | Attending: Obstetrics and Gynecology | Admitting: Obstetrics and Gynecology

## 2022-11-22 VITALS — BP 112/80 | Ht 67.0 in | Wt 223.0 lb

## 2022-11-22 DIAGNOSIS — Z3169 Encounter for other general counseling and advice on procreation: Secondary | ICD-10-CM

## 2022-11-22 DIAGNOSIS — Z124 Encounter for screening for malignant neoplasm of cervix: Secondary | ICD-10-CM | POA: Diagnosis not present

## 2022-11-22 DIAGNOSIS — Z713 Dietary counseling and surveillance: Secondary | ICD-10-CM

## 2022-11-22 DIAGNOSIS — Z01419 Encounter for gynecological examination (general) (routine) without abnormal findings: Secondary | ICD-10-CM

## 2022-11-22 DIAGNOSIS — Z9889 Other specified postprocedural states: Secondary | ICD-10-CM | POA: Insufficient documentation

## 2022-11-22 DIAGNOSIS — Z7182 Exercise counseling: Secondary | ICD-10-CM | POA: Diagnosis not present

## 2022-11-22 DIAGNOSIS — Z1151 Encounter for screening for human papillomavirus (HPV): Secondary | ICD-10-CM | POA: Diagnosis not present

## 2022-11-22 NOTE — Patient Instructions (Signed)
I value your feedback and you entrusting us with your care. If you get a [facility] patient survey, I would appreciate you taking the time to let us know about your experience today. Thank you! ? ? ?

## 2022-11-23 LAB — CYTOLOGY - PAP
Comment: NEGATIVE
Diagnosis: NEGATIVE
High risk HPV: NEGATIVE

## 2022-12-06 DIAGNOSIS — Z419 Encounter for procedure for purposes other than remedying health state, unspecified: Secondary | ICD-10-CM | POA: Diagnosis not present

## 2022-12-20 ENCOUNTER — Other Ambulatory Visit (HOSPITAL_COMMUNITY): Payer: Self-pay

## 2023-01-06 DIAGNOSIS — Z419 Encounter for procedure for purposes other than remedying health state, unspecified: Secondary | ICD-10-CM | POA: Diagnosis not present

## 2023-01-19 ENCOUNTER — Ambulatory Visit: Payer: Medicaid Other | Admitting: "Endocrinology

## 2023-02-06 DIAGNOSIS — Z419 Encounter for procedure for purposes other than remedying health state, unspecified: Secondary | ICD-10-CM | POA: Diagnosis not present

## 2023-03-08 DIAGNOSIS — Z419 Encounter for procedure for purposes other than remedying health state, unspecified: Secondary | ICD-10-CM | POA: Diagnosis not present

## 2023-04-08 DIAGNOSIS — Z419 Encounter for procedure for purposes other than remedying health state, unspecified: Secondary | ICD-10-CM | POA: Diagnosis not present

## 2023-05-08 DIAGNOSIS — Z419 Encounter for procedure for purposes other than remedying health state, unspecified: Secondary | ICD-10-CM | POA: Diagnosis not present

## 2023-06-08 DIAGNOSIS — Z419 Encounter for procedure for purposes other than remedying health state, unspecified: Secondary | ICD-10-CM | POA: Diagnosis not present

## 2023-07-09 DIAGNOSIS — Z419 Encounter for procedure for purposes other than remedying health state, unspecified: Secondary | ICD-10-CM | POA: Diagnosis not present

## 2023-08-06 DIAGNOSIS — Z419 Encounter for procedure for purposes other than remedying health state, unspecified: Secondary | ICD-10-CM | POA: Diagnosis not present

## 2023-09-17 DIAGNOSIS — Z419 Encounter for procedure for purposes other than remedying health state, unspecified: Secondary | ICD-10-CM | POA: Diagnosis not present

## 2023-09-19 ENCOUNTER — Other Ambulatory Visit (HOSPITAL_COMMUNITY): Payer: Self-pay

## 2023-10-03 DIAGNOSIS — H5213 Myopia, bilateral: Secondary | ICD-10-CM | POA: Diagnosis not present

## 2023-10-17 DIAGNOSIS — Z419 Encounter for procedure for purposes other than remedying health state, unspecified: Secondary | ICD-10-CM | POA: Diagnosis not present

## 2023-11-01 DIAGNOSIS — J020 Streptococcal pharyngitis: Secondary | ICD-10-CM | POA: Diagnosis not present

## 2023-11-17 DIAGNOSIS — Z419 Encounter for procedure for purposes other than remedying health state, unspecified: Secondary | ICD-10-CM | POA: Diagnosis not present

## 2023-12-05 DIAGNOSIS — Z1159 Encounter for screening for other viral diseases: Secondary | ICD-10-CM | POA: Diagnosis not present

## 2023-12-05 DIAGNOSIS — Z1331 Encounter for screening for depression: Secondary | ICD-10-CM | POA: Diagnosis not present

## 2023-12-05 DIAGNOSIS — E119 Type 2 diabetes mellitus without complications: Secondary | ICD-10-CM | POA: Diagnosis not present

## 2023-12-05 DIAGNOSIS — E0590 Thyrotoxicosis, unspecified without thyrotoxic crisis or storm: Secondary | ICD-10-CM | POA: Diagnosis not present

## 2023-12-05 DIAGNOSIS — Z9889 Other specified postprocedural states: Secondary | ICD-10-CM | POA: Diagnosis not present

## 2023-12-05 DIAGNOSIS — Z9049 Acquired absence of other specified parts of digestive tract: Secondary | ICD-10-CM | POA: Diagnosis not present

## 2023-12-05 DIAGNOSIS — E782 Mixed hyperlipidemia: Secondary | ICD-10-CM | POA: Diagnosis not present

## 2023-12-17 DIAGNOSIS — Z419 Encounter for procedure for purposes other than remedying health state, unspecified: Secondary | ICD-10-CM | POA: Diagnosis not present

## 2024-01-17 DIAGNOSIS — Z419 Encounter for procedure for purposes other than remedying health state, unspecified: Secondary | ICD-10-CM | POA: Diagnosis not present

## 2024-02-17 DIAGNOSIS — Z419 Encounter for procedure for purposes other than remedying health state, unspecified: Secondary | ICD-10-CM | POA: Diagnosis not present

## 2024-03-05 DIAGNOSIS — B351 Tinea unguium: Secondary | ICD-10-CM | POA: Diagnosis not present

## 2024-03-05 DIAGNOSIS — E119 Type 2 diabetes mellitus without complications: Secondary | ICD-10-CM | POA: Diagnosis not present

## 2024-05-18 DIAGNOSIS — Z419 Encounter for procedure for purposes other than remedying health state, unspecified: Secondary | ICD-10-CM | POA: Diagnosis not present

## 2024-06-05 DIAGNOSIS — R059 Cough, unspecified: Secondary | ICD-10-CM | POA: Diagnosis not present

## 2024-06-05 DIAGNOSIS — R0981 Nasal congestion: Secondary | ICD-10-CM | POA: Diagnosis not present

## 2024-06-05 DIAGNOSIS — J069 Acute upper respiratory infection, unspecified: Secondary | ICD-10-CM | POA: Diagnosis not present

## 2024-06-08 DIAGNOSIS — E119 Type 2 diabetes mellitus without complications: Secondary | ICD-10-CM | POA: Diagnosis not present

## 2024-06-08 DIAGNOSIS — E782 Mixed hyperlipidemia: Secondary | ICD-10-CM | POA: Diagnosis not present
